# Patient Record
Sex: Female | Born: 1970 | Race: White | Hispanic: No | Marital: Married | Smoking: Never smoker
Health system: Southern US, Community
[De-identification: ages and names within clinical notes are randomized; demographics above are authoritative.]

## PROBLEM LIST (undated history)

## (undated) ENCOUNTER — Inpatient Hospital Stay (HOSPITAL_COMMUNITY): Payer: BC Managed Care – PPO

## (undated) DIAGNOSIS — O1490 Unspecified pre-eclampsia, unspecified trimester: Secondary | ICD-10-CM

## (undated) DIAGNOSIS — R609 Edema, unspecified: Secondary | ICD-10-CM

## (undated) DIAGNOSIS — Z98891 History of uterine scar from previous surgery: Secondary | ICD-10-CM

## (undated) DIAGNOSIS — K219 Gastro-esophageal reflux disease without esophagitis: Secondary | ICD-10-CM

## (undated) DIAGNOSIS — J302 Other seasonal allergic rhinitis: Secondary | ICD-10-CM

## (undated) DIAGNOSIS — R51 Headache: Secondary | ICD-10-CM

## (undated) HISTORY — PX: NO PAST SURGERIES: SHX2092

---

## 2000-04-12 ENCOUNTER — Other Ambulatory Visit: Admission: RE | Admit: 2000-04-12 | Discharge: 2000-04-12 | Payer: Self-pay | Admitting: Family Medicine

## 2001-05-15 ENCOUNTER — Emergency Department (HOSPITAL_COMMUNITY): Admission: EM | Admit: 2001-05-15 | Discharge: 2001-05-15 | Payer: Self-pay | Admitting: Emergency Medicine

## 2002-08-15 ENCOUNTER — Ambulatory Visit (HOSPITAL_COMMUNITY): Admission: RE | Admit: 2002-08-15 | Discharge: 2002-08-15 | Payer: Self-pay | Admitting: Family Medicine

## 2002-08-15 ENCOUNTER — Encounter: Payer: Self-pay | Admitting: Family Medicine

## 2003-02-24 ENCOUNTER — Ambulatory Visit (HOSPITAL_COMMUNITY): Admission: RE | Admit: 2003-02-24 | Discharge: 2003-02-24 | Payer: Self-pay | Admitting: Family Medicine

## 2003-02-24 ENCOUNTER — Encounter: Payer: Self-pay | Admitting: Family Medicine

## 2003-04-14 ENCOUNTER — Other Ambulatory Visit: Admission: RE | Admit: 2003-04-14 | Discharge: 2003-04-14 | Payer: Self-pay | Admitting: Obstetrics and Gynecology

## 2003-07-17 ENCOUNTER — Inpatient Hospital Stay (HOSPITAL_COMMUNITY): Admission: AD | Admit: 2003-07-17 | Discharge: 2003-07-17 | Payer: Self-pay | Admitting: Obstetrics and Gynecology

## 2003-07-18 ENCOUNTER — Inpatient Hospital Stay (HOSPITAL_COMMUNITY): Admission: AD | Admit: 2003-07-18 | Discharge: 2003-07-18 | Payer: Self-pay | Admitting: Obstetrics and Gynecology

## 2003-10-16 ENCOUNTER — Inpatient Hospital Stay (HOSPITAL_COMMUNITY): Admission: AD | Admit: 2003-10-16 | Discharge: 2003-10-20 | Payer: Self-pay | Admitting: Obstetrics and Gynecology

## 2003-10-17 ENCOUNTER — Encounter (INDEPENDENT_AMBULATORY_CARE_PROVIDER_SITE_OTHER): Payer: Self-pay | Admitting: Specialist

## 2004-09-02 ENCOUNTER — Other Ambulatory Visit: Admission: RE | Admit: 2004-09-02 | Discharge: 2004-09-02 | Payer: Self-pay | Admitting: Family Medicine

## 2006-03-09 ENCOUNTER — Other Ambulatory Visit: Admission: RE | Admit: 2006-03-09 | Discharge: 2006-03-09 | Payer: Self-pay | Admitting: Family Medicine

## 2007-03-11 ENCOUNTER — Emergency Department (HOSPITAL_COMMUNITY): Admission: EM | Admit: 2007-03-11 | Discharge: 2007-03-11 | Payer: Self-pay | Admitting: Emergency Medicine

## 2007-04-30 ENCOUNTER — Emergency Department (HOSPITAL_COMMUNITY): Admission: EM | Admit: 2007-04-30 | Discharge: 2007-05-01 | Payer: Self-pay | Admitting: Emergency Medicine

## 2007-08-16 ENCOUNTER — Other Ambulatory Visit: Admission: RE | Admit: 2007-08-16 | Discharge: 2007-08-16 | Payer: Self-pay | Admitting: Family Medicine

## 2008-08-18 ENCOUNTER — Other Ambulatory Visit: Admission: RE | Admit: 2008-08-18 | Discharge: 2008-08-18 | Payer: Self-pay | Admitting: Family Medicine

## 2008-09-18 ENCOUNTER — Encounter: Admission: RE | Admit: 2008-09-18 | Discharge: 2008-09-18 | Payer: Self-pay | Admitting: Family Medicine

## 2008-10-03 ENCOUNTER — Emergency Department (HOSPITAL_COMMUNITY): Admission: EM | Admit: 2008-10-03 | Discharge: 2008-10-04 | Payer: Self-pay | Admitting: Emergency Medicine

## 2008-10-04 ENCOUNTER — Emergency Department (HOSPITAL_BASED_OUTPATIENT_CLINIC_OR_DEPARTMENT_OTHER): Admission: EM | Admit: 2008-10-04 | Discharge: 2008-10-05 | Payer: Self-pay | Admitting: Emergency Medicine

## 2008-10-04 ENCOUNTER — Ambulatory Visit: Payer: Self-pay | Admitting: Diagnostic Radiology

## 2009-08-20 ENCOUNTER — Other Ambulatory Visit: Admission: RE | Admit: 2009-08-20 | Discharge: 2009-08-20 | Payer: Self-pay | Admitting: Family Medicine

## 2010-05-23 NOTE — L&D Delivery Note (Signed)
Brief Op Note PreOp Dx IUP @ 38+, SROM, undesired fertility, declines VBAC PostOp Dx same, delivered Procedure rLTCS, BTL by [facility] method Surgeon Bovard, Ashlynd Michna Anes Spinal Findings viable female infant at 22:47, apgars 9/9; wt  7#2oz,  Moderate meconium and nuchal cord noted.   nl uterus, tubes, ovaries Comp none Path placenta to L&D,B  tubal segments to path Dispo stable to PACU Dict # [id number]  Jeanella Cara, MD

## 2010-06-21 LAB — GC/CHLAMYDIA PROBE AMP, GENITAL
Chlamydia: NEGATIVE
Gonorrhea: NEGATIVE

## 2010-06-21 LAB — HEPATITIS B SURFACE ANTIGEN: Hepatitis B Surface Ag: NEGATIVE

## 2010-06-21 LAB — HIV ANTIBODY (ROUTINE TESTING W REFLEX): HIV: NONREACTIVE

## 2010-06-21 LAB — ABO/RH: "RH Type ": POSITIVE

## 2010-06-21 LAB — SYPHILIS: RPR W/REFLEX TO RPR TITER AND TREPONEMAL ANTIBODIES, TRADITIONAL SCREENING AND DIAGNOSIS ALGORITHM: RPR: NONREACTIVE

## 2010-06-21 LAB — RUBELLA ANTIBODY, IGM: Rubella: IMMUNE

## 2010-08-12 ENCOUNTER — Other Ambulatory Visit: Payer: Self-pay | Admitting: Obstetrics and Gynecology

## 2010-08-12 DIAGNOSIS — N644 Mastodynia: Secondary | ICD-10-CM

## 2010-08-13 ENCOUNTER — Other Ambulatory Visit: Payer: Self-pay

## 2010-08-31 LAB — COMPREHENSIVE METABOLIC PANEL
AST: 24 U/L (ref 0–37)
Albumin: 3.9 g/dL (ref 3.5–5.2)
BUN: 11 mg/dL (ref 6–23)
CO2: 24 mEq/L (ref 19–32)
Chloride: 105 mEq/L (ref 96–112)
Chloride: 106 mEq/L (ref 96–112)
Creatinine, Ser: 0.8 mg/dL (ref 0.4–1.2)
GFR calc Af Amer: >60 mL/min (ref >60)
GFR calc non Af Amer: >60 mL/min (ref >60)
GFR calc non Af Amer: >60 mL/min (ref >60)
Glucose, Bld: 87 mg/dL (ref 70–99)
Potassium: 3.6 mEq/L (ref 3.5–5.1)
Sodium: 136 mEq/L (ref 135–145)
Total Bilirubin: 0.6 mg/dL (ref 0.3–1.2)
Total Protein: 6.9 g/dL (ref 6.0–8.3)

## 2010-08-31 LAB — WET PREP, GENITAL
Clue Cells Wet Prep HPF POC: NONE SEEN
Trich, Wet Prep: NONE SEEN

## 2010-08-31 LAB — DIFFERENTIAL
Basophils Absolute: 0.1 10*3/uL (ref 0.0–0.1)
Basophils Relative: 1 % (ref 0–1)
Eosinophils Relative: 3 % (ref 0–5)
Lymphocytes Relative: 28 % (ref 12–46)
Monocytes Absolute: 0.7 10*3/uL (ref 0.1–1.0)
Neutrophils Relative %: 59 % (ref 43–77)

## 2010-08-31 LAB — PREGNANCY, URINE: Preg Test, Ur: NEGATIVE

## 2010-08-31 LAB — CBC
HCT: 38.9 % (ref 36.0–46.0)
Hemoglobin: 12.9 g/dL (ref 12.0–15.0)
MCV: 89.8 fL (ref 78.0–100.0)
Platelets: 200 10*3/uL (ref 150–400)

## 2010-08-31 LAB — POCT PREGNANCY, URINE: Preg Test, Ur: NEGATIVE

## 2010-08-31 LAB — URINALYSIS, ROUTINE W REFLEX MICROSCOPIC
Bilirubin Urine: NEGATIVE
Glucose, UA: NEGATIVE mg/dL
Glucose, UA: NEGATIVE mg/dL
Hgb urine dipstick: NEGATIVE
Nitrite: NEGATIVE
Specific Gravity, Urine: 1.016 (ref 1.005–1.030)
Specific Gravity, Urine: 1.026 (ref 1.005–1.030)

## 2010-08-31 LAB — GC/CHLAMYDIA PROBE AMP, GENITAL
Chlamydia, DNA Probe: NEGATIVE
GC Probe Amp, Genital: NEGATIVE

## 2010-08-31 LAB — LIPASE, BLOOD: Lipase: 20 U/L (ref 11–59)

## 2010-08-31 LAB — URINE CULTURE: Colony Count: NO GROWTH

## 2010-10-08 NOTE — Discharge Summary (Signed)
NAME:  Dawn Dalton, Dawn Dalton                          ACCOUNT NO.:  [id number]   MEDICAL RECORD NO.:  [id number]                   PATIENT TYPE:  INP   LOCATION:  9112                                 FACILITY:  [facility]   PHYSICIAN:  Malachi Pro. Ambrose Mantle, M.D.              DATE OF BIRTH:  02/05/1971   DATE OF ADMISSION:  10/16/2003  DATE OF DISCHARGE:  10/20/2003                                 DISCHARGE SUMMARY   PATIENT IDENTIFICATION:  This is a 40 year old white married female; para 0,  gravida 1, with an EDC of November 01, 2003 by ultrasound.  She was admitted in  early labor with an elevated temperature.   PRENATAL LAB DATA:  Blood group and type 0 positive, with a negative  antibody, nonreactive serology, Rubella immune, hepatitis B surface antigen  negative.  HIV declined.  GC and chlamydia negative.  Group B strep  positive.  One-hour Glucola 111.   HOSPITAL COURSE:  The patient's prenatal course and progress in labor has  been described in the history and physical.  In summary, she was admitted,  observed in labor with an elevated fever -- the origin of which was  uncertain.  She was treated with Unasyn.  She reached full dilatation,  pushed for three hours, was unable to bring the vertex below a 0 station.  She was taken to the operating room and underwent a low transverse cervical  cesarean section by Dr. Ambrose Mantle, under spinal anesthesia and with delivery of  a living female infant -- 6 pounds 10 ounces. Apgars were 2 at one minute  and 9 at five minutes.   Dr. Chales Abrahams T. Dimaguila thought the low Apgar at one minute was due to  secretions in the baby's upper airway.  The cord pH was 7.27.   At the time of the cesarean section the patient was noted to have a knot in  the cord and also multiple fibroids.  Postpartum she became afebrile, had a  good postpartum course. She was ambulating well, voiding well, passing  flatus and had a bowel movement.  On the third postoperative day was  ready  for discharge.   Staples were removed and strips were applied.   LABORATORY DATA:  Urine culture was negative.  Initial hemoglobin 13.6,  hematocrit 40.7, white count 14,400, platelet count 255,000.  Follow-up  hemoglobin 12.5, white count 21,400.  Comprehensive metabolic profile was  normal.  RPR was nonreactive.   FINAL DIAGNOSES:  1. Intrauterine pregnancy at 37+ weeks.  Delivered OP by cesarean section.  2. Maternal fever of unknown origin.  3. Cephalopelvic disproportion.  4. Multiple fibroids.   OPERATION:  Low transverse cervical cesarean section.   FINAL CONDITION:  Improved.   DISCHARGE INSTRUCTIONS:  Our regular discharge instruction booklet.  Percocet 5/325 mg 20 tablets one q.4-6h. p.r.n. pain.  The patient is  advised to return in 10-14 days for follow-up  examination.                                               Malachi Pro. Ambrose Mantle, M.D.    TFH/MEDQ  D:  10/20/2003  T:  10/20/2003  Job:  [id number]

## 2010-10-08 NOTE — Op Note (Signed)
NAME:  Dawn Dalton, Dawn Dalton                          ACCOUNT NO.:  [id number]   MEDICAL RECORD NO.:  [id number]                   PATIENT TYPE:  INP   LOCATION:  9112                                 FACILITY:  [facility]   PHYSICIAN:  Malachi Pro. Ambrose Mantle, M.D.              DATE OF BIRTH:  [birth year]   DATE OF PROCEDURE:  10/17/2003  DATE OF DISCHARGE:                                 OPERATIVE REPORT   PREOPERATIVE DIAGNOSES:  1. Intrauterine pregnancy at 37+ weeks.  2. Cephalopelvic disproportion.  3. Maternal fever, unknown origin.  4. Multiple fibroids.   POSTOPERATIVE DIAGNOSIS:  1. Intrauterine pregnancy at 37+ weeks.  2. Cephalopelvic disproportion.  3. Maternal fever, unknown origin.  4. Multiple fibroids.  5. Vertex occiput posterior.  6. Knot in the cord.   OPERATION:  Low transverse cervical cesarean section.   OPERATOR:  Malachi Pro. Ambrose Mantle, M.D.   ANESTHESIA:  Spinal.   The patient was brought to the operating room and the epidural anesthetic  was apparently ineffective so Dr. Arby Barrette did a spinal.  This took close to  15 minutes because either the spinal fluid pressure was low or he could not  aspirate the fluid even though he was in the spinal canal.  Finally he did  give the anesthetic, she became completely numb at the incision site.  The  abdomen was prepped with Betadine solution after fetal heart tones were  confirmed to be normal.  The area was draped as a sterile field and the  transverse incision was made and carried in layers through the skin,  subcutaneous tissue, and fascia.  The fascia was separated from the rectus  muscles superiorly and inferiorly.  The rectus muscle was split in the  midline . The peritoneum was opened vertically.  The lower uterine segment  peritoneum was incised and pushed inferiorly.  The bladder was retracted  away and incision was then made through the superficial layer of the  myometrium and then with my finger I entered the amniotic  cavity; clear  fluid was obtained.  The shoulder was right underneath the incision. I  enlarged the incision by pulling inferiorly and superiorly on the incision.  The infant's head was OP directly and was sort of wedged into the pelvis.  I  was able to get below it and also with a little bit of the nurse's  assistance pushing the head up, rotated the head around to an anterior  position and then delivered the vertex without difficulty.  The nose and  pharynx were suctioned with the bulb.  The remainder of the baby was  delivered, the cord was clamped, and the infant was given to Dr. Francine Graven  who was in attendance.  The infant was a 6-pound 10-ounce female with Apgars  of 2 - 1 for heart rate and 1 for respirations at one minute, and 9 at five  minutes.  Dr. Francine Graven  felt like the low Apgar came from secretions in the  upper airway.  A segment of cord was preserved in case a pH was necessary.  Dr. Francine Graven advised getting the pH.  The pH was 7.27.  A knot was found in  the cord.  Routine cord blood studies were obtained.  The placenta was  removed.  The inside of the uterus was inspected and found to be free of  debris.  There was what felt like a submucous fibroid, maybe intramural and  submucous, on the left anterior mid uterus.  There were multiple fibroids  present subserosally.  After the edges of the myometrium were grasped with  ring forceps the uterine incision was closed with two running sutures of 0  Vicryl, locking the first layer and nonlocking on the second.  One extra  suture was required for complete hemostasis.  Liberal irrigation confirmed  hemostasis.  Multiple fibroids were again noted.  Both tubes and ovaries  appeared normal.  Both gutters were blotted free of blood.  Hemostasis was  adequate.  The abdominal wall was closed by closing the rectus muscle with  interrupted 0 Vicryl, two running sutures of 0 Vicryl were used to close the  fascia, a running 3-0 Vicryl in  the subcutaneous tissue, and staples on the  skin.  Neither the visceral nor the parietal peritoneum were reapproximated.  Blood loss was thought to be about 1000 mL even though this was not  accounted in the suction.  The urine output prior to the C-section was low.  There was only about 100 mL during the procedure so we are going to change  the catheter.  The patient seemed tolerate the procedure well and was  returned to recovery in satisfactory condition.                                               Malachi Pro. Ambrose Mantle, M.D.    TFH/MEDQ  D:  10/17/2003  T:  10/17/2003  Job:  [id number]

## 2010-10-08 NOTE — H&P (Signed)
NAME:  Dawn Dalton, Dawn Dalton                          ACCOUNT NO.:  [id number]   MEDICAL RECORD NO.:  [id number]                   PATIENT TYPE:  INP   LOCATION:  9112                                 [facility]   PHYSICIAN:  Malachi Pro. Ambrose Mantle, M.D.              DATE OF BIRTH:  17-Nov-1970   DATE OF ADMISSION:  10/16/2003  DATE OF DISCHARGE:                                HISTORY & PHYSICAL   HISTORY OF PRESENT ILLNESS:  A 39 year old white married female para 0  gravida 1 with [facility] November 01, 2003 by ultrasound admitted in early labor with  elevated temperature.  Blood group and type O positive, negative antibody,  nonreactive serology, rubella immune, hepatitis B surface antigen negative,  HIV declined, GC and chlamydia negative, group B strep positive, one-hour  Glucola 111.  Vaginal ultrasound on March 13, 2003;  Crown-rump length  0.81 cm, 6 weeks 5 days; [facility] November 01, 2003.  Positive urine group B strep  was treated with Penicillin VK.  The patient had bleeding at 12+ weeks.  Ultrasound at 18 weeks 3 days showed an [facility] of November 02, 2003 with multiple  fibroids.  The patient had contractions at 26 weeks.  Fetal fibronectin was  negative.  She took terbutaline on an as-needed basis.  On Oct 15, 2003 the  patient had an exam in our office and the cervix was 1 cm, 80%.  She  returned to our office on Oct 16, 2003.  She was sent to maternity admission  and labor ensued.  She had no history of fever.  She had no GI or urinary  symptoms except frequency.   PAST MEDICAL HISTORY:  1. Allergies to EES caused stomach pain, VIBRAMYCIN causes hives.  2. Illnesses:  Abnormal Pap, depression.  3. Operations:  Dental surgery, LEEP surgery done in 2002.   FAMILY HISTORY:  Maternal grandmother with an MI and mini stroke.  Father  with renal disease, kidney cancer, and depression.  Brother with  schizophrenia.   ALCOHOL, TOBACCO, AND DRUGS:  None.   PHYSICAL EXAMINATION:  VITAL SIGNS:  On admission  the patient's temperature  was 100.6; it actually went as high as 101+.  Blood pressure 130/73, pulse  83, respirations 20.  HEART AND LUNGS:  Normal.  ABDOMEN:  Soft, fundal height 38 cm, fetal heart tones normal.  The baseline  fetal heart was 160 thought to be secondary to maternal temperature  elevation.  There was good reactivity, no decelerations.  PELVIC:  The cervix was 3 cm, 90%, vertex at a -2.   The patient was initially given penicillin for the group B strep but after  it was discovered that she had an elevated temperature she was placed on  Unasyn.  She received an epidural at approximately 7:20 p.m.  At one point  there was a fetal heart rate deceleration and she received ephedrine.  Fetal  heart  rate tachycardia persisted, probably secondary to elevated maternal  temperature.  Variability was intermittent - at times the variability was  excellent, at other times the baby seemed somewhat flat.  There were no  significant decelerations.  By 11:10 p.m. the cervix was 8 cm.  She reached  full dilatation at 12:25 a.m. and by 2:25 a.m. the vertex was at a 0  station.  I could not be sure if it was OP  or OA.  The patient did not want an instrumental delivery.  She was given  another 40 minutes or so and at 3 a.m. the cervix was unchanged.  It was  still 10 cm, vertex was still at a 0 station, and vertex was probably OP.  We decided to proceed with C-section.  The patient was informed and she and  her husband agreed.                                               Malachi Pro. Ambrose Mantle, M.D.    TFH/MEDQ  D:  10/17/2003  T:  10/17/2003  Job:  [id number]

## 2010-12-24 ENCOUNTER — Inpatient Hospital Stay (HOSPITAL_COMMUNITY)
Admission: AD | Admit: 2010-12-24 | Discharge: 2010-12-24 | Disposition: A | Discharge status: Home / Self Care | Payer: BC Managed Care – PPO | Source: Ambulatory Visit | Attending: Obstetrics and Gynecology | Admitting: Obstetrics and Gynecology

## 2010-12-24 ENCOUNTER — Encounter (HOSPITAL_COMMUNITY): Payer: Self-pay

## 2010-12-24 DIAGNOSIS — O133 Gestational [pregnancy-induced] hypertension without significant proteinuria, third trimester: Secondary | ICD-10-CM

## 2010-12-24 DIAGNOSIS — O139 Gestational [pregnancy-induced] hypertension without significant proteinuria, unspecified trimester: Secondary | ICD-10-CM | POA: Insufficient documentation

## 2010-12-24 LAB — URINALYSIS, ROUTINE W REFLEX MICROSCOPIC
Bilirubin Urine: NEGATIVE
Ketones, ur: 15 mg/dL — AB
Nitrite: NEGATIVE
Protein, ur: NEGATIVE mg/dL
Specific Gravity, Urine: >1.03 — ABNORMAL HIGH (ref 1.005–1.030)
Urobilinogen, UA: 0.2 mg/dL (ref 0.0–1.0)

## 2010-12-24 LAB — COMPREHENSIVE METABOLIC PANEL
Albumin: 2.8 g/dL — ABNORMAL LOW (ref 3.5–5.2)
Alkaline Phosphatase: 110 U/L (ref 39–117)
BUN: 13 mg/dL (ref 6–23)
Chloride: 104 mEq/L (ref 96–112)
Creatinine, Ser: 0.7 mg/dL (ref 0.50–1.10)
GFR calc Af Amer: >60 mL/min (ref >60)
GFR calc non Af Amer: >60 mL/min (ref >60)
Glucose, Bld: 97 mg/dL (ref 70–99)
Potassium: 4.3 mEq/L (ref 3.5–5.1)
Total Bilirubin: 0.2 mg/dL — ABNORMAL LOW (ref 0.3–1.2)

## 2010-12-24 LAB — CBC
HCT: 33.5 % — ABNORMAL LOW (ref 36.0–46.0)
Hemoglobin: 11.4 g/dL — ABNORMAL LOW (ref 12.0–15.0)
MCV: 88.2 fL (ref 78.0–100.0)
RDW: 12.7 % (ref 11.5–15.5)
WBC: 9.1 10*3/uL (ref 4.0–10.5)

## 2010-12-24 LAB — URINE MICROSCOPIC-ADD ON

## 2010-12-24 LAB — LACTATE DEHYDROGENASE: LDH: 172 U/L (ref 94–250)

## 2010-12-24 LAB — URIC ACID: Uric Acid, Serum: 6.1 mg/dL (ref 2.4–7.0)

## 2010-12-24 NOTE — ED Provider Notes (Signed)
S:  Dawn Dalton is a 40 y.o. G2P1001 at [redacted]w[redacted]d sent for labs and further evaluation following her routine office visit due to BP elevation. She reports this was the first episode of BP elevation. No H/A, visual disturbance, epigastric pain. No LOF or VB. Good FM. Reports nl 3hr OGTT. O: Filed Vitals:   12/24/10 1552  BP: 134/86  Pulse: 92  Temp:   Resp:    BP range 132-140/88-98 FHR 130 baseline, reactive, mod variability, Cat I  Results for orders placed during the hospital encounter of 12/24/10 (from the past 24 hour(s))  URINALYSIS, ROUTINE W REFLEX MICROSCOPIC     Status: Abnormal   Collection Time   12/24/10  3:10 PM      Component Value Range   Color, Urine YELLOW  YELLOW    Appearance HAZY (*) CLEAR    Specific Gravity, Urine >1.030 (*) 1.005 - 1.030    pH 5.5  5.0 - 8.0    Glucose, UA NEGATIVE  NEGATIVE (mg/dL)   Hgb urine dipstick TRACE (*) NEGATIVE    Bilirubin Urine NEGATIVE  NEGATIVE    Ketones, ur 15 (*) NEGATIVE (mg/dL)   Protein, ur NEGATIVE  NEGATIVE (mg/dL)   Urobilinogen, UA 0.2  0.0 - 1.0 (mg/dL)   Nitrite NEGATIVE  NEGATIVE    Leukocytes, UA NEGATIVE  NEGATIVE   URINE MICROSCOPIC-ADD ON     Status: Abnormal   Collection Time   12/24/10  3:10 PM      Component Value Range   Squamous Epithelial / LPF FEW (*) RARE    WBC, UA 0-2  <3 (WBC/hpf)   Crystals CA OXALATE CRYSTALS (*) NEGATIVE   CBC     Status: Abnormal   Collection Time   12/24/10  3:30 PM      Component Value Range   WBC 9.1  4.0 - 10.5 (K/uL)   RBC 3.80 (*) 3.87 - 5.11 (MIL/uL)   Hemoglobin 11.4 (*) 12.0 - 15.0 (g/dL)   HCT 40.9 (*) 81.1 - 46.0 (%)   MCV 88.2  78.0 - 100.0 (fL)   MCH 30.0  26.0 - 34.0 (pg)   MCHC 34.0  30.0 - 36.0 (g/dL)   RDW 91.4  78.2 - 95.6 (%)   Platelets 205  150 - 400 (K/uL)   Results for orders placed during the hospital encounter of 12/24/10 (from the past 24 hour(s))  URINALYSIS, ROUTINE W REFLEX MICROSCOPIC     Status: Abnormal   Collection Time   12/24/10  3:10  PM      Component Value Range   Color, Urine YELLOW  YELLOW    Appearance HAZY (*) CLEAR    Specific Gravity, Urine >1.030 (*) 1.005 - 1.030    pH 5.5  5.0 - 8.0    Glucose, UA NEGATIVE  NEGATIVE (mg/dL)   Hgb urine dipstick TRACE (*) NEGATIVE    Bilirubin Urine NEGATIVE  NEGATIVE    Ketones, ur 15 (*) NEGATIVE (mg/dL)   Protein, ur NEGATIVE  NEGATIVE (mg/dL)   Urobilinogen, UA 0.2  0.0 - 1.0 (mg/dL)   Nitrite NEGATIVE  NEGATIVE    Leukocytes, UA NEGATIVE  NEGATIVE   URINE MICROSCOPIC-ADD ON     Status: Abnormal   Collection Time   12/24/10  3:10 PM      Component Value Range   Squamous Epithelial / LPF FEW (*) RARE    WBC, UA 0-2  <3 (WBC/hpf)   Crystals CA OXALATE CRYSTALS (*) NEGATIVE   CBC  Status: Abnormal   Collection Time   12/24/10  3:30 PM      Component Value Range   WBC 9.1  4.0 - 10.5 (K/uL)   RBC 3.80 (*) 3.87 - 5.11 (MIL/uL)   Hemoglobin 11.4 (*) 12.0 - 15.0 (g/dL)   HCT 40.9 (*) 81.1 - 46.0 (%)   MCV 88.2  78.0 - 100.0 (fL)   MCH 30.0  26.0 - 34.0 (pg)   MCHC 34.0  30.0 - 36.0 (g/dL)   RDW 91.4  78.2 - 95.6 (%)   Platelets 205  150 - 400 (K/uL)  COMPREHENSIVE METABOLIC PANEL     Status: Abnormal   Collection Time   12/24/10  3:30 PM      Component Value Range   Sodium 136  135 - 145 (mEq/L)   Potassium 4.3  3.5 - 5.1 (mEq/L)   Chloride 104  96 - 112 (mEq/L)   CO2 24  19 - 32 (mEq/L)   Glucose, Bld 97  70 - 99 (mg/dL)   BUN 13  6 - 23 (mg/dL)   Creatinine, Ser 2.13  0.50 - 1.10 (mg/dL)   Calcium 9.1  8.4 - 08.6 (mg/dL)   Total Protein 6.7  6.0 - 8.3 (g/dL)   Albumin 2.8 (*) 3.5 - 5.2 (g/dL)   AST 16  0 - 37 (U/L)   ALT 12  0 - 35 (U/L)   Alkaline Phosphatase 110  39 - 117 (U/L)   Total Bilirubin 0.2 (*) 0.3 - 1.2 (mg/dL)   GFR calc non Af Amer >60  >60 (mL/min)   GFR calc Af Amer >60  >60 (mL/min)  URIC ACID     Status: Normal   Collection Time   12/24/10  3:30 PM      Component Value Range   Uric Acid, Serum 6.1  2.4 - 7.0 (mg/dL)  LACTATE  DEHYDROGENASE     Status: Normal   Collection Time   12/24/10  3:30 PM      Component Value Range   LD 172  94 - 250 (U/L)   A/P: GHTN without evidence preE. Discussed w/ Dr. Senaida Ores. F/U in office 12/28/10. Home with preE precautions, kick counts.

## 2010-12-24 NOTE — Progress Notes (Signed)
Pt states she has had more swelling in her feet, ankles and hands. Has had nausea and some periods of dizziness. No headache or blurred vision. Pt reports good fetal movement. Was sent from the office for evaluation of elevated BP/

## 2011-01-07 ENCOUNTER — Encounter (HOSPITAL_COMMUNITY): Payer: Self-pay

## 2011-01-07 ENCOUNTER — Encounter (HOSPITAL_COMMUNITY)
Admission: RE | Admit: 2011-01-07 | Discharge: 2011-01-07 | Disposition: A | Discharge status: Home / Self Care | Payer: BC Managed Care – PPO | Source: Ambulatory Visit | Attending: Obstetrics and Gynecology | Admitting: Obstetrics and Gynecology

## 2011-01-07 ENCOUNTER — Encounter (HOSPITAL_COMMUNITY): Payer: Self-pay | Admitting: Anesthesiology

## 2011-01-07 ENCOUNTER — Encounter (HOSPITAL_COMMUNITY): Payer: Self-pay | Admitting: *Deleted

## 2011-01-07 HISTORY — DX: Edema, unspecified: R60.9

## 2011-01-07 HISTORY — DX: Gastro-esophageal reflux disease without esophagitis: K21.9

## 2011-01-07 HISTORY — DX: Other seasonal allergic rhinitis: J30.2

## 2011-01-07 HISTORY — DX: Headache: R51

## 2011-01-07 LAB — BASIC METABOLIC PANEL
BUN: 13 mg/dL (ref 6–23)
CO2: 24 mEq/L (ref 19–32)
Calcium: 8.7 mg/dL (ref 8.4–10.5)
GFR calc non Af Amer: >60 mL/min (ref >60)
Glucose, Bld: 92 mg/dL (ref 70–99)
Sodium: 135 mEq/L (ref 135–145)

## 2011-01-07 LAB — CBC
HCT: 34.4 % — ABNORMAL LOW (ref 36.0–46.0)
MCH: 30.1 pg (ref 26.0–34.0)
MCV: 87.8 fL (ref 78.0–100.0)
RBC: 3.92 MIL/uL (ref 3.87–5.11)
RDW: 13.1 % (ref 11.5–15.5)
WBC: 10.2 10*3/uL (ref 4.0–10.5)

## 2011-01-07 NOTE — Patient Instructions (Addendum)
Dawn Dalton  Your procedure is scheduled [id number] 01/18/11 Enter through the Main Entrance of [facility] at:6 AM Pick up the phone a the desk and dial [phone] Please call this number if you have any problems the morning of surgery: [phone]  Remember: Do not eat food after midnight  Do not drink clear liquids after:night Take these medicines the morning of surgery with a SIP OF WATER:zantac  Do not wear jewelry, make-up, or nail polish Do not wear lotions, powders, or perfumes.no deodorants Do not shave 48 hours prior to surgery. Do not bring valuables to the hospital. Leave suitcase in the car. After Surgery it may be brought to your room. For patients being admitted to the hospital, checkout time is 11:00am the day of discharge.  Please read over the Hibiclens Guide to General Skin Cleansing at Home. Remember that hibiclens is not to be used on the head, face, or vaginal area. Please shower with 1/2 bottle the evening before surgery and other 1/2 bottle morning of surgery prior to arrival at hospital.  Do not use any deodorants, lotions, or powders after hibiclens shower.

## 2011-01-07 NOTE — Pre-Procedure Instructions (Signed)
Dr Senaida Ores office nurse notified of pt allergy to Cefdinir (cephalosporin) and past reaction to PCN and all derivatives..the patient does not want to receive ancef that is ordered.office notified. Awaiting further orders for antibiotic [facility]

## 2011-01-07 NOTE — Anesthesia Preprocedure Evaluation (Signed)
Anesthesia Evaluation  Name, MR# and DOB Patient awake  General Assessment Comment  Reviewed: Allergy & Precautions, H&P , NPO status , Patient's Chart, lab work & pertinent test results, reviewed documented beta blocker date and time   History of Anesthesia Complications Negative for: history of anesthetic complications  Airway Mallampati: I TM Distance: >3 FB Neck ROM: full    Dental  (+) Teeth Intact   Pulmonary  clear to auscultation  breath sounds clear to auscultation none    Cardiovascular hypertension (elev BP x2 weeks, had pre-e w/u which was neg), regular Normal    Neuro/Psych   Headaches (recent migraine, has had HAs x3 days)   Negative Psych ROS  GI/Hepatic/Renal negative Liver ROS, and negative Renal ROS (+)  GERD Medicated and Poorly Controlled     Endo/Other  Negative Endocrine ROS (+)      Abdominal   Musculoskeletal negative musculoskeletal ROS (+)   Hematology negative hematology ROS (+)   Peds  Reproductive/Obstetrics (+) Pregnancy    Anesthesia Other Findings             Anesthesia Physical Anesthesia Plan  ASA: II  Anesthesia Plan: Spinal   Post-op Pain Management:    Induction:   Airway Management Planned:   Additional Equipment:   Intra-op Plan:   Post-operative Plan:   Informed Consent: I have reviewed the patients History and Physical, chart, labs and discussed the procedure including the risks, benefits and alternatives for the proposed anesthesia with the patient or authorized representative who has indicated his/her understanding and acceptance.   Dental Advisory Given  Plan Discussed with:   Anesthesia Plan Comments:         Anesthesia Quick Evaluation

## 2011-01-15 ENCOUNTER — Inpatient Hospital Stay: Admit: 2011-01-15 | Payer: Self-pay | Admitting: Obstetrics and Gynecology

## 2011-01-15 ENCOUNTER — Encounter (HOSPITAL_COMMUNITY)
Admission: AD | Disposition: A | Discharge status: Home / Self Care | Payer: Self-pay | Source: Ambulatory Visit | Attending: Obstetrics and Gynecology

## 2011-01-15 ENCOUNTER — Encounter (HOSPITAL_COMMUNITY): Payer: Self-pay | Admitting: Obstetrics and Gynecology

## 2011-01-15 ENCOUNTER — Inpatient Hospital Stay (HOSPITAL_COMMUNITY)
Admission: AD | Admit: 2011-01-15 | Discharge: 2011-01-18 | DRG: 371 | Disposition: A | Discharge status: Home / Self Care | Payer: BC Managed Care – PPO | Source: Ambulatory Visit | Attending: Obstetrics and Gynecology | Admitting: Obstetrics and Gynecology

## 2011-01-15 ENCOUNTER — Encounter (HOSPITAL_COMMUNITY): Payer: Self-pay | Admitting: Anesthesiology

## 2011-01-15 ENCOUNTER — Inpatient Hospital Stay (HOSPITAL_COMMUNITY): Payer: BC Managed Care – PPO | Admitting: Anesthesiology

## 2011-01-15 ENCOUNTER — Other Ambulatory Visit: Payer: Self-pay | Admitting: Obstetrics and Gynecology

## 2011-01-15 DIAGNOSIS — Z3490 Encounter for supervision of normal pregnancy, unspecified, unspecified trimester: Secondary | ICD-10-CM

## 2011-01-15 DIAGNOSIS — Z98891 History of uterine scar from previous surgery: Secondary | ICD-10-CM

## 2011-01-15 DIAGNOSIS — O09529 Supervision of elderly multigravida, unspecified trimester: Secondary | ICD-10-CM | POA: Diagnosis present

## 2011-01-15 DIAGNOSIS — O1490 Unspecified pre-eclampsia, unspecified trimester: Secondary | ICD-10-CM

## 2011-01-15 DIAGNOSIS — Z302 Encounter for sterilization: Secondary | ICD-10-CM

## 2011-01-15 DIAGNOSIS — O34219 Maternal care for unspecified type scar from previous cesarean delivery: Principal | ICD-10-CM | POA: Diagnosis present

## 2011-01-15 HISTORY — DX: History of uterine scar from previous surgery: Z98.891

## 2011-01-15 HISTORY — DX: Unspecified pre-eclampsia, unspecified trimester: O14.90

## 2011-01-15 LAB — COMPREHENSIVE METABOLIC PANEL
ALT: 27 U/L (ref 0–35)
BUN: 12 mg/dL (ref 6–23)
CO2: 20 mEq/L (ref 19–32)
Calcium: 9.3 mg/dL (ref 8.4–10.5)
Creatinine, Ser: 0.73 mg/dL (ref 0.50–1.10)
GFR calc Af Amer: >60 mL/min (ref >60)
GFR calc non Af Amer: >60 mL/min (ref >60)
Glucose, Bld: 115 mg/dL — ABNORMAL HIGH (ref 70–99)
Sodium: 134 mEq/L — ABNORMAL LOW (ref 135–145)
Total Protein: 6.1 g/dL (ref 6.0–8.3)

## 2011-01-15 LAB — CBC
Hemoglobin: 11.4 g/dL — ABNORMAL LOW (ref 12.0–15.0)
MCH: 29.8 pg (ref 26.0–34.0)
MCHC: 34 g/dL (ref 30.0–36.0)
MCV: 87.7 fL (ref 78.0–100.0)
RBC: 3.82 MIL/uL — ABNORMAL LOW (ref 3.87–5.11)

## 2011-01-15 LAB — URIC ACID: Uric Acid, Serum: 5.9 mg/dL (ref 2.4–7.0)

## 2011-01-15 SURGERY — Surgical Case
Anesthesia: Spinal | Site: Abdomen | Laterality: Bilateral | Wound class: Clean Contaminated

## 2011-01-15 MED ORDER — SCOPOLAMINE 1 MG/3DAYS TD PT72
1.0000 | MEDICATED_PATCH | Freq: Once | TRANSDERMAL | Status: DC
  Administered 2011-01-16: 1.5 mg via TRANSDERMAL

## 2011-01-15 MED ORDER — CITRIC ACID-SODIUM CITRATE 334-500 MG/5ML PO SOLN
ORAL | Status: AC
  Filled 2011-01-15: qty 15

## 2011-01-15 MED ORDER — SODIUM CHLORIDE 0.9 % IR SOLN
Status: DC | PRN
  Administered 2011-01-15: 1000 mL

## 2011-01-15 MED ORDER — ONDANSETRON HCL 4 MG/2ML IJ SOLN
INTRAMUSCULAR | Status: DC | PRN
  Administered 2011-01-15: 4 mg via INTRAVENOUS

## 2011-01-15 MED ORDER — LACTATED RINGERS IV BOLUS (SEPSIS)
1000.0000 mL | Freq: Once | INTRAVENOUS | Status: AC
  Administered 2011-01-15: 1000 mL via INTRAVENOUS

## 2011-01-15 MED ORDER — KETOROLAC TROMETHAMINE 60 MG/2ML IM SOLN
60.0000 mg | Freq: Once | INTRAMUSCULAR | Status: AC | PRN
  Administered 2011-01-16: 60 mg via INTRAMUSCULAR

## 2011-01-15 MED ORDER — FENTANYL CITRATE 0.05 MG/ML IJ SOLN
INTRAMUSCULAR | Status: AC
  Filled 2011-01-15: qty 2

## 2011-01-15 MED ORDER — OXYTOCIN 10 UNIT/ML IJ SOLN
INTRAMUSCULAR | Status: AC
  Filled 2011-01-15: qty 4

## 2011-01-15 MED ORDER — FENTANYL CITRATE 0.05 MG/ML IJ SOLN
INTRAMUSCULAR | Status: DC | PRN
  Administered 2011-01-15: 85 ug via INTRAVENOUS

## 2011-01-15 MED ORDER — MORPHINE SULFATE (PF) 0.5 MG/ML IJ SOLN
INTRAMUSCULAR | Status: DC | PRN
  Administered 2011-01-15: 1 mg via EPIDURAL
  Administered 2011-01-15: 1 mg via INTRAVENOUS

## 2011-01-15 MED ORDER — FENTANYL CITRATE 0.05 MG/ML IJ SOLN
INTRAMUSCULAR | Status: DC | PRN
  Administered 2011-01-15: 15 ug via INTRATHECAL

## 2011-01-15 MED ORDER — LACTATED RINGERS IV SOLN
INTRAVENOUS | Status: DC | PRN
  Administered 2011-01-15: 22:00:00 via INTRAVENOUS

## 2011-01-15 MED ORDER — BUPIVACAINE IN DEXTROSE 0.75-8.25 % IT SOLN
INTRATHECAL | Status: DC | PRN
  Administered 2011-01-15: 1.5 mL via INTRATHECAL

## 2011-01-15 MED ORDER — CLINDAMYCIN PHOSPHATE 600 MG/50ML IV SOLN
INTRAVENOUS | Status: DC | PRN
  Administered 2011-01-15: 900 mg via INTRAVENOUS

## 2011-01-15 MED ORDER — OXYTOCIN 10 UNIT/ML IJ SOLN
40.0000 [IU] | INTRAVENOUS | Status: DC | PRN
  Administered 2011-01-15: 40 [IU] via INTRAVENOUS

## 2011-01-15 MED ORDER — MEPERIDINE HCL 25 MG/ML IJ SOLN
6.2500 mg | INTRAMUSCULAR | Status: DC | PRN
  Administered 2011-01-16 (×2): 6.25 mg via INTRAVENOUS

## 2011-01-15 MED ORDER — MORPHINE SULFATE (PF) 0.5 MG/ML IJ SOLN
INTRAMUSCULAR | Status: DC | PRN
  Administered 2011-01-15: .1 mg via INTRATHECAL

## 2011-01-15 MED ORDER — GENTAMICIN IN SALINE 1.6-0.9 MG/ML-% IV SOLN
INTRAVENOUS | Status: DC | PRN
  Administered 2011-01-15: 100 mg via INTRAVENOUS

## 2011-01-15 MED ORDER — OXYTOCIN 20 UNITS IN LACTATED RINGERS INFUSION - SIMPLE
125.0000 mL/h | INTRAVENOUS | Status: AC
  Administered 2011-01-16 (×2): 125 mL/h via INTRAVENOUS
  Filled 2011-01-15: qty 1000

## 2011-01-15 MED ORDER — ONDANSETRON HCL 4 MG/2ML IJ SOLN
INTRAMUSCULAR | Status: AC
  Filled 2011-01-15: qty 2

## 2011-01-15 MED ORDER — GENTAMICIN SULFATE 40 MG/ML IJ SOLN
INTRAVENOUS | Status: DC
  Filled 2011-01-15: qty 2.5

## 2011-01-15 MED ORDER — CITRIC ACID-SODIUM CITRATE 334-500 MG/5ML PO SOLN
30.0000 mL | Freq: Once | ORAL | Status: AC
  Administered 2011-01-15: 30 mL via ORAL

## 2011-01-15 MED ORDER — MORPHINE SULFATE 0.5 MG/ML IJ SOLN
INTRAMUSCULAR | Status: AC
  Filled 2011-01-15: qty 10

## 2011-01-15 SURGICAL SUPPLY — 31 items
CHLORAPREP W/TINT 26ML (MISCELLANEOUS) ×2 IMPLANT
CLOTH BEACON ORANGE TIMEOUT ST (SAFETY) ×2 IMPLANT
CONTAINER PREFILL 10% NBF 15ML (MISCELLANEOUS) ×4 IMPLANT
DRSG COVADERM 4X10 (GAUZE/BANDAGES/DRESSINGS) ×2 IMPLANT
ELECT REM PT RETURN 9FT ADLT (ELECTROSURGICAL) ×2
ELECTRODE REM PT RTRN 9FT ADLT (ELECTROSURGICAL) ×1 IMPLANT
EXTRACTOR VACUUM M CUP 4 TUBE (SUCTIONS) IMPLANT
GLOVE BIO SURGEON STRL SZ 6.5 (GLOVE) ×2 IMPLANT
GLOVE BIO SURGEON STRL SZ7 (GLOVE) ×2 IMPLANT
GOWN PREVENTION PLUS LG XLONG (DISPOSABLE) ×6 IMPLANT
KIT ABG SYR 3ML LUER SLIP (SYRINGE) IMPLANT
NEEDLE HYPO 25X5/8 SAFETYGLIDE (NEEDLE) IMPLANT
NS IRRIG 1000ML POUR BTL (IV SOLUTION) ×2 IMPLANT
PACK C SECTION WH (CUSTOM PROCEDURE TRAY) ×2 IMPLANT
RTRCTR C-SECT PINK 25CM LRG (MISCELLANEOUS) ×2 IMPLANT
SLEEVE SCD COMPRESS KNEE MED (MISCELLANEOUS) ×2 IMPLANT
STAPLER VISISTAT 35W (STAPLE) ×2 IMPLANT
SUT MNCRL 0 VIOLET CTX 36 (SUTURE) ×2 IMPLANT
SUT MONOCRYL 0 CTX 36 (SUTURE) ×2
SUT PLAIN 1 NONE 54 (SUTURE) ×2 IMPLANT
SUT PLAIN 2 0 (SUTURE) ×2
SUT PLAIN 2 0 XLH (SUTURE) IMPLANT
SUT PLAIN ABS 2-0 CT1 27XMFL (SUTURE) ×1 IMPLANT
SUT VIC AB 0 CT1 27 (SUTURE) ×6
SUT VIC AB 0 CT1 27XBRD ANBCTR (SUTURE) ×3 IMPLANT
SUT VIC AB 2-0 CT1 27 (SUTURE) ×2
SUT VIC AB 2-0 CT1 TAPERPNT 27 (SUTURE) ×1 IMPLANT
SYR BULB IRRIGATION 50ML (SYRINGE) IMPLANT
TOWEL OR 17X24 6PK STRL BLUE (TOWEL DISPOSABLE) ×4 IMPLANT
TRAY FOLEY CATH 14FR (SET/KITS/TRAYS/PACK) ×2 IMPLANT
WATER STERILE IRR 1000ML POUR (IV SOLUTION) IMPLANT

## 2011-01-15 NOTE — Consult Note (Signed)
Neonatology Note:   Attendance at C-section:    I was asked to attend this repeat C/S at term in labor. The mother is a G2P1 O pos, GBS pos with onset of labor tonight, elevated BP, and moderate meconium. The mother received Clindamycin and Gentamicin shortly PTD.  Infant vigorous with good spontaneous cry and tone. Needed only minimal bulb suctioning. Ap 9/9. Lungs clear to ausc in DR. To CN to care of Pediatrician.   Deatra James, MD

## 2011-01-15 NOTE — ED Provider Notes (Signed)
Dawn Dalton is a 40 y.o. female presenting for R/O ROM at ~2015. She reports having a small gush of yellow-brown fluid that ran down her legs and frequent, mod-strong UC's since. She it scheduled for a repeat C/S on 01/18/11. She also reports generalized mild abd pain today and denied HA or vision changes. History OB History    Grav Para Term Preterm Abortions TAB SAB Ect Mult Living   2 1 1  0 0 0 0 0 0 1     Past Medical History  Diagnosis Date  . GERD (gastroesophageal reflux disease)     zantac otc, pregnancy related  . Seasonal allergies   . Headache   . Edema end of pregnancy    generalized edema-pt states was seen in Dr Berenda Morale office 8/15-elevated b/p , h/a, worked up for preeclampsia   Past Surgical History  Procedure Date  . No past surgeries   . Cesarean section 2005   Family History: family history is not on file. Social History:  reports that she has never smoked. She has never used smokeless tobacco. She reports that she does not drink alcohol or use illicit drugs.  ROS    Blood pressure 162/120, pulse 96, temperature 98.7 F (37.1 C), temperature source Oral, resp. rate 18. Maternal Exam:  Uterine Assessment: Contraction strength is firm.  Contraction duration is 60 seconds. Contraction frequency is regular.   Abdomen: Surgical scars: low transverse.   Fundal height is S=D.   Fetal presentation: vertex  Introitus: Normal vulva. Vagina is positive for vaginal discharge.  Ferning test: positive.  Nitrazine test: not done. Amniotic fluid character: meconium stained.  Pelvis: adequate for delivery.   Cervix: Cervix evaluated by sterile speculum exam and digital exam.     Fetal Exam Fetal Monitor Review: Mode: ultrasound.   Baseline rate: 140.  Variability: moderate (6-25 bpm).   Pattern: accelerations present and no decelerations.    Fetal State Assessment: Category I - tracings are normal.     Physical Exam  Constitutional: She is oriented to  person, place, and time. She appears well-developed and well-nourished. She appears distressed.  Cardiovascular: Normal rate.   Respiratory: Effort normal.  GI: Soft. There is no tenderness.  Genitourinary: Uterus normal. No bleeding around the vagina. Injury: mod amount of green mucus, neg pooling. Vaginal discharge found.  Musculoskeletal: Normal range of motion. She exhibits edema (3+ pedal edema).  Neurological: She is alert and oriented to person, place, and time. She has normal reflexes.       Neg clonus  Skin: Skin is warm and dry.  Psychiatric:       anxious     SVE: FT, long, -3, ?vtx Prenatal labs: ABO, Rh: O/Positive/-- (01/30 0000) Antibody: Negative (01/30 0000) Rubella:   RPR: NON REACTIVE (08/17 1150)  HBsAg: Negative (01/30 0000)  HIV: Non-reactive (01/30 0000)  GBS:   Pos in urine  Assessment/Plan: Assessment: 1. SROM MSF  2. Early labor, extremely uncomfortable 3. For planned RLTCS 4. FHR category I 5. Gest HTN vs Pre-eclampsia 6. GBS pos in urine  Plan: 1. Admit for RLTCS per consult w/ Dr. Ellyn Hack 2. PIH labs 3. Serial BPs 4. Prep for OR 5. Prophylaxis for GBS per Dr. Cheyenne Adas, [place] 01/15/2011, 9:48 PM

## 2011-01-15 NOTE — Progress Notes (Signed)
"  I was getting out of the car and I felt a gush of fluid that ran down my legs. It was greenish, brown, creamy liquid.  Nothing has happened since."

## 2011-01-15 NOTE — H&P (Signed)
Dawn Dalton is a 40 y.o. female presenting for rLTCS/BTL. Maternal Medical History:  Reason for admission: Reason for admission: rupture of membranes and contractions.    OB History    Grav Para Term Preterm Abortions TAB SAB Ect Mult Living   2 1 1  0 0 0 0 0 0 1    G! 2005 6#10 c/s; G2 present NVP, small fibroids, desires BTL; h/o abnormal pap, no STDs Past Medical History  Diagnosis Date  . GERD (gastroesophageal reflux disease)     zantac otc, pregnancy related  . Seasonal allergies   . Headache   . Edema end of pregnancy    generalized edema-pt states was seen in Dr Berenda Morale office 8/15-elevated b/p , h/a, worked up for preeclampsia  Depression - no meds for years Past Surgical History  Procedure Date  . No past surgeries   . Cesarean section 2005  LEEP, dental Family History:depression, HTN, Kidney dz, kidney CA, MI, Migraines, schizophrenia, skin CA, TIA, cousin with Down;s syndrome Social History:  reports that she has never smoked. She has never used smokeless tobacco. 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   HENT: Negative.   Eyes: Negative.   Respiratory: Negative.   Cardiovascular: Negative.   Gastrointestinal: Negative.   Genitourinary: Negative.   Musculoskeletal: Negative.   Skin: Negative.   Neurological: Negative.   Endo/Heme/Allergies: Negative.   Psychiatric/Behavioral: Negative.       Blood pressure 162/120, pulse 96, temperature 98.7 F (37.1 C), temperature source Oral, resp. rate 18. Exam Physical Exam  Constitutional: She is oriented to person, place, and time. She appears well-developed and well-nourished.  HENT:  Head: Normocephalic and atraumatic.  Neck: Normal range of motion. Neck supple. No thyromegaly present.  Cardiovascular: Normal rate and regular rhythm.   Respiratory: Effort normal and breath sounds normal.  GI: Soft. Bowel sounds are normal.    Musculoskeletal: Normal range of motion.  Neurological: She is alert and oriented to person, place, and time.  Skin: Skin is warm.  Psychiatric: She has a normal mood and affect.    Prenatal labs: ABO, Rh: O/Positive/-- (01/30 0000) Antibody: Negative (01/30 0000) Rubella:  Immune RPR: NON REACTIVE (08/17 1150)  HBsAg: Negative (01/30 0000)  HIV: Non-reactive (01/30 0000)  GBS:  positive glucola 142, nl 3hr GTT, Plt 274K, GC neg, Chl neg, declined genetic screening  US 7+4, dated by [place] Anat [place] nl anat, female, small fibroids, posterior plac   Assessment/Plan: 40yo G2P1 at 38+ with SROM for rLTCS and BTL, d/w pt R/B/A of LTCS and BTL Will monitor BP closely, PIH labs    BOVARD,Shannara Winbush 01/15/2011, 9:28 PM

## 2011-01-15 NOTE — Anesthesia Preprocedure Evaluation (Addendum)
Anesthesia Evaluation  Name, MR# and DOB Patient awake  General Assessment Comment  Reviewed: Allergy & Precautions, H&P , NPO status , Patient's Chart, lab work & pertinent test results, reviewed documented beta blocker date and time   History of Anesthesia Complications Negative for: history of anesthetic complications  Airway Mallampati: I TM Distance: >3 FB Neck ROM: full    Dental  (+) Teeth Intact   Pulmonary  clear to auscultation  breath sounds clear to auscultation none    Cardiovascular hypertension (elev BP x2 weeks, had pre-e w/u which was neg), regular Normal    Neuro/Psych   Headaches (recent migraine, has had HAs x3 days)   Negative Psych ROS  GI/Hepatic/Renal   negative Liver ROS  negative Renal ROS   GERD Medicated and Poorly Controlled     Endo/Other  Negative Endocrine ROS (+)      Abdominal   Musculoskeletal negative musculoskeletal ROS (+)   Hematology negative hematology ROS (+)   Peds  Reproductive/Obstetrics (+) Pregnancy    Anesthesia Other Findings             Anesthesia Physical Anesthesia Plan  ASA: III  Anesthesia Plan: Spinal   Post-op Pain Management:    Induction:   Airway Management Planned:   Additional Equipment:   Intra-op Plan:   Post-operative Plan:   Informed Consent: I have reviewed the patients History and Physical, chart, labs and discussed the procedure including the risks, benefits and alternatives for the proposed anesthesia with the patient or authorized representative who has indicated his/her understanding and acceptance.     Plan Discussed with: CRNA and Surgeon  Anesthesia Plan Comments:         Anesthesia Quick Evaluation

## 2011-01-15 NOTE — Anesthesia Procedure Notes (Signed)
Spinal Block  Patient location during procedure: OR Start time: 01/15/2011 10:31 PM Staffing Performed by: anesthesiologist  Preanesthetic Checklist Completed: patient identified, site marked, surgical consent, pre-op evaluation, timeout performed, IV checked, risks and benefits discussed and monitors and equipment checked Spinal Block Patient position: sitting Prep: site prepped and draped and DuraPrep Patient monitoring: heart rate, cardiac monitor, continuous pulse ox and blood pressure Approach: midline Location: L3-4 Injection technique: single-shot Needle Needle type: Sprotte  Needle gauge: 24 G Needle length: 5 cm Assessment Sensory level: T4

## 2011-01-15 NOTE — Transfer of Care (Signed)
  Anesthesia Post-op Note  Patient: Dawn Dalton  Procedure(s) Performed:  CESAREAN SECTION WITH BILATERAL TUBAL LIGATION - Repeat Cesarean Section with Delivery Baby Boy @ 2247, Apgars 9/9; Bilateral Tubal Ligation  Patient Location: PACU  Anesthesia Type: Spinal  Level of Consciousness: awake, alert  and oriented  Airway and Oxygen Therapy: Patient Spontanous Breathing  Post-op Pain: none  Post-op Assessment: Post-op Vital signs reviewed, Patient's Cardiovascular Status Stable and Respiratory Function Stable  Post-op Vital Signs: stable  Complications: No apparent anesthesia complications

## 2011-01-15 NOTE — Anesthesia Postprocedure Evaluation (Signed)
Anesthesia Post Note  Patient: Dawn Dalton  Procedure(s) Performed:  CESAREAN SECTION WITH BILATERAL TUBAL LIGATION - Repeat Cesarean Section with Delivery Baby Boy @ 2247, Apgars 9/9; Bilateral Tubal Ligation  Anesthesia type: Epidural  Patient location: PACU  Post pain: Pain level controlled  Post assessment: Post-op Vital signs reviewed  Last Vitals:  Filed Vitals:   01/15/11 2345  BP: 125/88  Pulse: 81  Temp:   Resp: 17    Post vital signs: Reviewed  Level of consciousness: awake  Complications: No apparent anesthesia complications

## 2011-01-15 NOTE — Plan of Care (Signed)
Spoke to dr Rodman Pickle about patient. Informed her that patient a total of 300mg  po zantac at 2000pm. Order not to administer the pepcid 20mg  ivpiggyback. Just give bictra po.

## 2011-01-16 ENCOUNTER — Encounter (HOSPITAL_COMMUNITY): Payer: Self-pay | Admitting: *Deleted

## 2011-01-16 LAB — COMPREHENSIVE METABOLIC PANEL
BUN: 12 mg/dL (ref 6–23)
CO2: 24 mEq/L (ref 19–32)
Calcium: 9 mg/dL (ref 8.4–10.5)
Creatinine, Ser: 0.73 mg/dL (ref 0.50–1.10)
GFR calc Af Amer: >60 mL/min (ref >60)
GFR calc non Af Amer: >60 mL/min (ref >60)
Glucose, Bld: 81 mg/dL (ref 70–99)

## 2011-01-16 LAB — CBC
HCT: 34.5 % — ABNORMAL LOW (ref 36.0–46.0)
MCH: 29 pg (ref 26.0–34.0)
MCHC: 32.8 g/dL (ref 30.0–36.0)
RDW: 13.5 % (ref 11.5–15.5)

## 2011-01-16 LAB — ABO/RH: ABO/RH(D): O POS

## 2011-01-16 LAB — URINALYSIS, ROUTINE W REFLEX MICROSCOPIC
Ketones, ur: NEGATIVE mg/dL
Leukocytes, UA: NEGATIVE
Nitrite: NEGATIVE
Protein, ur: 30 mg/dL — AB
pH: 7 (ref 5.0–8.0)

## 2011-01-16 LAB — URINE MICROSCOPIC-ADD ON

## 2011-01-16 LAB — RPR: RPR Ser Ql: NONREACTIVE

## 2011-01-16 LAB — LACTATE DEHYDROGENASE: LDH: 223 U/L (ref 94–250)

## 2011-01-16 MED ORDER — NALBUPHINE SYRINGE 5 MG/0.5 ML
5.0000 mg | INJECTION | INTRAMUSCULAR | Status: DC | PRN
  Filled 2011-01-16: qty 1

## 2011-01-16 MED ORDER — SODIUM CHLORIDE 0.9 % IJ SOLN
3.0000 mL | INTRAMUSCULAR | Status: DC | PRN
  Administered 2011-01-17: 3 mL via INTRAVENOUS

## 2011-01-16 MED ORDER — DIPHENHYDRAMINE HCL 25 MG PO CAPS
25.0000 mg | ORAL_CAPSULE | Freq: Four times a day (QID) | ORAL | Status: DC | PRN
  Administered 2011-01-16: 25 mg via ORAL
  Filled 2011-01-16: qty 1

## 2011-01-16 MED ORDER — IBUPROFEN 800 MG PO TABS
800.0000 mg | ORAL_TABLET | Freq: Three times a day (TID) | ORAL | Status: DC
  Administered 2011-01-16 – 2011-01-17 (×6): 800 mg via ORAL
  Filled 2011-01-16 (×5): qty 1

## 2011-01-16 MED ORDER — METOCLOPRAMIDE HCL 5 MG/ML IJ SOLN: 10.0000 mg | Freq: Three times a day (TID) | INTRAMUSCULAR | Status: DC | PRN

## 2011-01-16 MED ORDER — TETANUS-DIPHTH-ACELL PERTUSSIS 5-2.5-18.5 LF-MCG/0.5 IM SUSP
0.5000 mL | Freq: Once | INTRAMUSCULAR | Status: AC
  Administered 2011-01-17: 0.5 mL via INTRAMUSCULAR
  Filled 2011-01-16: qty 0.5

## 2011-01-16 MED ORDER — SIMETHICONE 80 MG PO CHEW
80.0000 mg | CHEWABLE_TABLET | ORAL | Status: DC | PRN
  Administered 2011-01-18: 80 mg via ORAL

## 2011-01-16 MED ORDER — SIMETHICONE 80 MG PO CHEW
80.0000 mg | CHEWABLE_TABLET | Freq: Three times a day (TID) | ORAL | Status: DC
  Administered 2011-01-16 – 2011-01-18 (×7): 80 mg via ORAL

## 2011-01-16 MED ORDER — ONDANSETRON HCL 4 MG PO TABS
4.0000 mg | ORAL_TABLET | ORAL | Status: DC | PRN
  Administered 2011-01-18: 4 mg via ORAL
  Filled 2011-01-16: qty 1

## 2011-01-16 MED ORDER — NALOXONE HCL 0.4 MG/ML IJ SOLN: 0.4000 mg | INTRAMUSCULAR | Status: DC | PRN

## 2011-01-16 MED ORDER — MAGNESIUM SULFATE 40 G IN LACTATED RINGERS - SIMPLE
2.0000 g/h | INTRAVENOUS | Status: DC
  Administered 2011-01-16 (×2): 2 g/h via INTRAVENOUS
  Filled 2011-01-16 (×2): qty 500

## 2011-01-16 MED ORDER — SODIUM CHLORIDE 0.9 % IV SOLN
1.0000 ug/kg/h | INTRAVENOUS | Status: DC | PRN
  Filled 2011-01-16: qty 2.5

## 2011-01-16 MED ORDER — IBUPROFEN 600 MG PO TABS: 600.0000 mg | ORAL_TABLET | Freq: Four times a day (QID) | ORAL | Status: DC | PRN

## 2011-01-16 MED ORDER — FAMOTIDINE 20 MG PO TABS
20.0000 mg | ORAL_TABLET | Freq: Every day | ORAL | Status: DC
  Administered 2011-01-16 – 2011-01-18 (×3): 20 mg via ORAL
  Filled 2011-01-16 (×3): qty 1

## 2011-01-16 MED ORDER — SENNOSIDES-DOCUSATE SODIUM 8.6-50 MG PO TABS
2.0000 | ORAL_TABLET | Freq: Every day | ORAL | Status: DC
  Administered 2011-01-16 – 2011-01-17 (×2): 2 via ORAL

## 2011-01-16 MED ORDER — KETOROLAC TROMETHAMINE 60 MG/2ML IM SOLN
INTRAMUSCULAR | Status: AC
  Administered 2011-01-16: 60 mg via INTRAMUSCULAR
  Filled 2011-01-16: qty 2

## 2011-01-16 MED ORDER — DIPHENHYDRAMINE HCL 25 MG PO CAPS: 25.0000 mg | ORAL_CAPSULE | ORAL | Status: DC | PRN

## 2011-01-16 MED ORDER — ZOLPIDEM TARTRATE 5 MG PO TABS
5.0000 mg | ORAL_TABLET | Freq: Every evening | ORAL | Status: DC | PRN
  Administered 2011-01-16 – 2011-01-17 (×2): 5 mg via ORAL
  Filled 2011-01-16 (×2): qty 1

## 2011-01-16 MED ORDER — DIPHENHYDRAMINE HCL 50 MG/ML IJ SOLN
INTRAMUSCULAR | Status: AC
  Administered 2011-01-16: 12.5 mg via INTRAVENOUS
  Filled 2011-01-16: qty 1

## 2011-01-16 MED ORDER — MENTHOL 3 MG MT LOZG: 1.0000 | LOZENGE | OROMUCOSAL | Status: DC | PRN

## 2011-01-16 MED ORDER — PRENATAL PLUS 27-1 MG PO TABS
1.0000 | ORAL_TABLET | Freq: Every day | ORAL | Status: DC
  Administered 2011-01-18: 1 via ORAL
  Filled 2011-01-16 (×3): qty 1

## 2011-01-16 MED ORDER — LANOLIN HYDROUS EX OINT: 1.0000 "application " | TOPICAL_OINTMENT | CUTANEOUS | Status: DC | PRN

## 2011-01-16 MED ORDER — LACTATED RINGERS IV SOLN
INTRAVENOUS | Status: DC
  Administered 2011-01-16 – 2011-01-17 (×3): via INTRAVENOUS

## 2011-01-16 MED ORDER — DIBUCAINE 1 % RE OINT: 1.0000 "application " | TOPICAL_OINTMENT | RECTAL | Status: DC | PRN

## 2011-01-16 MED ORDER — DIPHENHYDRAMINE HCL 50 MG/ML IJ SOLN: 25.0000 mg | INTRAMUSCULAR | Status: DC | PRN

## 2011-01-16 MED ORDER — KETOROLAC TROMETHAMINE 30 MG/ML IJ SOLN: 30.0000 mg | Freq: Four times a day (QID) | INTRAMUSCULAR | Status: AC | PRN

## 2011-01-16 MED ORDER — WITCH HAZEL-GLYCERIN EX PADS: 1.0000 "application " | MEDICATED_PAD | CUTANEOUS | Status: DC | PRN

## 2011-01-16 MED ORDER — DIPHENHYDRAMINE HCL 50 MG/ML IJ SOLN
12.5000 mg | INTRAMUSCULAR | Status: DC | PRN
  Administered 2011-01-16: 12.5 mg via INTRAVENOUS

## 2011-01-16 MED ORDER — SCOPOLAMINE 1 MG/3DAYS TD PT72
MEDICATED_PATCH | TRANSDERMAL | Status: AC
  Administered 2011-01-16: 1.5 mg via TRANSDERMAL
  Filled 2011-01-16: qty 1

## 2011-01-16 MED ORDER — ONDANSETRON HCL 4 MG/2ML IJ SOLN: 4.0000 mg | Freq: Three times a day (TID) | INTRAMUSCULAR | Status: DC | PRN

## 2011-01-16 MED ORDER — MEPERIDINE HCL 25 MG/ML IJ SOLN
INTRAMUSCULAR | Status: AC
Start: 2011-01-16 — End: 2011-01-16
  Administered 2011-01-16: 6.25 mg via INTRAVENOUS
  Filled 2011-01-16: qty 1

## 2011-01-16 MED ORDER — LORATADINE 10 MG PO TABS
10.0000 mg | ORAL_TABLET | Freq: Every day | ORAL | Status: DC
  Filled 2011-01-16 (×4): qty 1

## 2011-01-16 MED ORDER — HYDROCHLOROTHIAZIDE 25 MG PO TABS
25.0000 mg | ORAL_TABLET | Freq: Every day | ORAL | Status: DC
  Administered 2011-01-16 – 2011-01-18 (×3): 25 mg via ORAL
  Filled 2011-01-16 (×4): qty 1

## 2011-01-16 MED ORDER — OXYCODONE-ACETAMINOPHEN 5-325 MG PO TABS
1.0000 | ORAL_TABLET | ORAL | Status: DC | PRN
  Administered 2011-01-16: 1 via ORAL
  Administered 2011-01-16: 2 via ORAL
  Administered 2011-01-16 (×2): 1 via ORAL
  Administered 2011-01-17: 2 via ORAL
  Administered 2011-01-17: 1 via ORAL
  Administered 2011-01-18: 2 via ORAL
  Filled 2011-01-16: qty 1
  Filled 2011-01-16 (×2): qty 2
  Filled 2011-01-16 (×2): qty 1
  Filled 2011-01-16 (×2): qty 2
  Filled 2011-01-16: qty 1

## 2011-01-16 MED ORDER — OXYTOCIN 20 UNITS IN LACTATED RINGERS INFUSION - SIMPLE
INTRAVENOUS | Status: AC
  Administered 2011-01-16: 125 mL/h via INTRAVENOUS
  Filled 2011-01-16: qty 1000

## 2011-01-16 MED ORDER — MAGNESIUM SULFATE BOLUS VIA INFUSION
4.0000 g | Freq: Once | INTRAVENOUS | Status: DC
  Administered 2011-01-16: 4 g via INTRAVENOUS
  Filled 2011-01-16: qty 500

## 2011-01-16 MED ORDER — CALCIUM CARBONATE ANTACID 500 MG PO CHEW: 2.0000 | CHEWABLE_TABLET | ORAL | Status: DC | PRN

## 2011-01-16 MED ORDER — ONDANSETRON HCL 4 MG/2ML IJ SOLN: 4.0000 mg | INTRAMUSCULAR | Status: DC | PRN

## 2011-01-16 NOTE — Plan of Care (Signed)
Problem: Phase II Progression Outcomes Goal: Other Phase II Outcomes/Goals PIH S&S resolving-Magnesium gtt d/c'd

## 2011-01-16 NOTE — Anesthesia Postprocedure Evaluation (Signed)
  Anesthesia Post-op Note  Patient: Dawn Dalton  Procedure(s) Performed:  CESAREAN SECTION WITH BILATERAL TUBAL LIGATION - Repeat Cesarean Section with Delivery Baby Boy @ 2247, Apgars 9/9; Bilateral Tubal Ligation  Patient Location: PACU and A-ICU  Anesthesia Type: Spinal  Level of Consciousness: awake, alert  and oriented  Airway and Oxygen Therapy: Patient Spontanous Breathing  Post-op Pain: mild  Post-op Assessment: Patient's Cardiovascular Status Stable, Respiratory Function Stable, Patent Airway and Pain level controlled  Post-op Vital Signs: stable  Complications: No apparent anesthesia complications

## 2011-01-16 NOTE — Progress Notes (Addendum)
Subjective: Postpartum Day 1: Cesarean Delivery/BTL Patient reports incisional pain and tolerating PO, c/o blurry vision, hot, dry throat - d/w pt Magnesium side effects.    Objective: Vital signs in last 24 hours: Temp:  [97.8 F (36.6 C)-98.7 F (37.1 C)] 97.9 F (36.6 C) (08/26 0800) Pulse Rate:  [81-112] 112  (08/26 1000) Resp:  [14-26] 20  (08/26 0800) BP: (120-170)/(69-120) 148/94 mmHg (08/26 1015) SpO2:  [95 %-98 %] 97 % (08/26 1000) Weight:  [87.499 kg (192 lb 14.4 oz)] 192 lb 14.4 oz (87.499 kg) (08/26 0145)  Physical Exam:  General: alert and no distress Lochia: appropriate Uterine Fundus: firm Incision: bandage intact DVT Evaluation: No evidence of DVT seen on physical exam.   Basename 01/16/11 0529 01/15/11 2144  HGB 11.3* 11.4*  HCT 34.5* 33.5*    Assessment/Plan: Status post Cesarean section/BTL. Doing well postoperatively.  Continue current care - magnesium for 24 hours - PIH labs in AM, BP elevated despite Mag, will start BP meds - HCTZ, not breastfeeding.  BOVARD,Errica Dutil 01/16/2011, 10:52 AM

## 2011-01-16 NOTE — Op Note (Signed)
NAME:  QUANTA, ROBERTSHAW NO.:  [id number]  MEDICAL RECORD NO.:  [id number]  LOCATION:  9372                          FACILITY:  [facility]  PHYSICIAN:  Sherron Monday, MD        DATE OF BIRTH:  12-19-1970  DATE OF PROCEDURE:  01/15/2011 DATE OF DISCHARGE:                              OPERATIVE REPORT   PREOPERATIVE DIAGNOSIS:  Intrauterine pregnancy at 38 plus weeks, spontaneous rupture of membranes, undesired facility, declines vaginal birth after cesarean.  POSTOPERATIVE DIAGNOSIS:  Intrauterine pregnancy at 38 plus weeks, spontaneous rupture of membranes, undesired facility, declines vaginal birth after cesarean, delivered.  PROCEDURE:  Repeat low transverse cesarean section, bilateral tubal ligation by [place] method.  SURGEON:  Sherron Monday, MD  ANESTHESIA:  Spinal.  FINDINGS:  Viable female infant at 2047 with Apgars of 9 at 1 minute and 9 at 5 minutes and a weight of 7 pounds 2 ounces, moderate meconium and nuchal cord noted, normal uterus, tubes, and ovaries.  COMPLICATIONS:  None.  PATHOLOGY:  Placenta to L and D, bilateral tubal segments to pathology.  DISPOSITION:  Stable to PACU following the procedure.  PROCEDURE:  The patient had confirmed rupture of membranes with meconium noted in the Maternity Admission Unit.  At this point, we discussed the risks, benefits, and alternatives of repeat low transverse cesarean section as well as bilateral tubal ligation including but not limited to bleeding, infection, damage to surrounding organs as well as failure of the tubal with a resulting ectopic pregnancy.  She voiced understanding and wished to proceed.  She was transferred to the OR where spinal anesthesia was induced, found to be adequate.  She was then placed in supine position with a leftward tilt.  She was sterilely prepped and draped and a catheter was sterilely placed in her bladder.  Pfannenstiel skin incision was made at the level of her previous  skin incision, carried through the underlying layer of fascia sharply.  The fascia was incised in the midline and the incision was extended laterally with Mayo scissors.  Inferior aspect of fascial incision was grasped with Kocher clamps, elevated.  The rectus muscles were dissected off bluntly and sharply.  Attention was then turned to the superior portion of the fascial incision and in a similar fashion was grasped with Kocher clamps, elevated, and the rectus muscles were dissected off both bluntly and sharply.  Midline was easily identified and with aid of hemostats, it was entered sharply.  The incision was extended superiorly and inferiorly with good visualization of the bladder.  The Alexis skin retractor was placed carefully checking to make sure that no bowel was entrapped.  The vesicouterine peritoneum was easily identified, tented up with some pickups and a bladder flap was created both digitally and sharply.  Uterus was incised in a transverse fashion and the infant was delivered from vertex presentation.  Nose and mouth were suctioned on the field.  Cord was clamped and cut.  The infant was handed off to the awaiting pediatric staff.  The placenta was expressed and the uterus was cleared of all clot and debris.  The uterine incision was closed with 2 layers of 0  Monocryl, the first of which was running locked and the second as an imbricating layer.  Hemostasis was assured.  Attention was turned to performing the tubal.  The tube was easily identified, followed out to the fimbriated end of the left.  The tube was tented up with Babcock.  In the mesosalpinx, a hole was made using Bovie cautery. The tubal was performed in the Park[place] fashion excising the intervening portions sending that to pathology.  Hemostasis was assured.  Attention was then turned to the right tube which in a similar fashion was tented up with the Babcock and an avascular window was seen in the  mesosalpinx and a window was made with Bovie cautery.  The tube was tied in a Parkland fashion.  Hemostasis was again assured.  The uterine incision was made hemostatic with Bovie cautery and figure-of-eight of 0 Vicryl. The pelvis had copious irrigation.  The Alexis retractor was removed. The peritoneum was reapproximated and the rectus muscles with 2-0 Vicryl.  The subfascial planes were made hemostatic as were the rectus muscles with Bovie cautery.  The fascia was reapproximated using 0 Vicryl in a running fashion.  Subcuticular adipose layer was made hemostatic with Bovie cautery and the dead space was closed with 2-0 plain gut.  Skin was closed with staples.  The patient tolerated the procedure well.  Sponge, lap, and needle count were correct x2 per the operating room staff at the end of the procedure.     Sherron Monday, MD     JB/MEDQ  D:  01/15/2011  T:  01/16/2011  Job:  [id number]

## 2011-01-17 ENCOUNTER — Encounter (HOSPITAL_COMMUNITY): Payer: Self-pay | Admitting: Obstetrics and Gynecology

## 2011-01-17 DIAGNOSIS — O1490 Unspecified pre-eclampsia, unspecified trimester: Secondary | ICD-10-CM

## 2011-01-17 HISTORY — DX: Unspecified pre-eclampsia, unspecified trimester: O14.90

## 2011-01-17 LAB — COMPREHENSIVE METABOLIC PANEL
ALT: 23 U/L (ref 0–35)
AST: 28 U/L (ref 0–37)
Albumin: 2.2 g/dL — ABNORMAL LOW (ref 3.5–5.2)
CO2: 28 mEq/L (ref 19–32)
Calcium: 6.3 mg/dL — CL (ref 8.4–10.5)
Creatinine, Ser: 0.81 mg/dL (ref 0.50–1.10)
Sodium: 137 mEq/L (ref 135–145)
Total Protein: 5.1 g/dL — ABNORMAL LOW (ref 6.0–8.3)

## 2011-01-17 LAB — CBC
MCH: 29.6 pg (ref 26.0–34.0)
MCV: 89.6 fL (ref 78.0–100.0)
Platelets: 202 10*3/uL (ref 150–400)
RBC: 3.55 MIL/uL — ABNORMAL LOW (ref 3.87–5.11)
RDW: 13.6 % (ref 11.5–15.5)

## 2011-01-17 NOTE — Progress Notes (Signed)
UR chart review completed.  

## 2011-01-17 NOTE — Progress Notes (Signed)
Pt assisted to bathroom for foley removal and pericare/peripad change.  While sitting on toilet pt stated she felt like she was going to faint; stated her vision started changing and fingers felt tingly.  Pt encouraged to inhale ammonia to prevent syncopal episode.  NT and RN from [facility] called to assist w/pt.  Pt placed in stedy to get her safely back to bed.  Pt remained conscious but stated she felt really bad.  Vital signs taken:  BP 123/90, HR 92, O2 Sat 94%.  Apple juice and ice water given, cold rag placed over pt's forehead.  Will continue to monitor.

## 2011-01-17 NOTE — Progress Notes (Signed)
Subjective: Postpartum Day 2: Cesarean Delivery Patient reports incisional pain, tolerating PO and no problems voiding.  Nl lochia, pain controlled.  Still feels hot and didn't sleep well. Good diuresis  Objective: Vital signs in last 24 hours: Temp:  [97.7 F (36.5 C)-98 F (36.7 C)] 97.8 F (36.6 C) (08/27 0500) Pulse Rate:  [83-112] 89  (08/27 0600) Resp:  [16-20] 20  (08/27 0600) BP: (121-154)/(81-106) 130/90 mmHg (08/27 0600) SpO2:  [93 %-98 %] 93 % (08/27 0600) Weight:  [87.454 kg (192 lb 12.8 oz)] 192 lb 12.8 oz (87.454 kg) (08/27 0600)  Physical Exam:  General: alert and no distress Lochia: appropriate Uterine Fundus: firm Incision: healing well, bandage intact DVT Evaluation: No evidence of DVT seen on physical exam.   Basename 01/17/11 0515 01/16/11 0529  HGB 10.5* 11.3*  HCT 31.8* 34.5*  labs stable  Assessment/Plan: Status post Cesarean section. Doing well postoperatively.  D/c magnesium, transfer to postpartum bed, monitor BP closely, continue HCTZ.  BOVARD,Badr Piedra 01/17/2011, 8:17 AM

## 2011-01-18 ENCOUNTER — Encounter (HOSPITAL_COMMUNITY): Admission: RE | Payer: Self-pay | Source: Ambulatory Visit

## 2011-01-18 ENCOUNTER — Inpatient Hospital Stay (HOSPITAL_COMMUNITY)
Admission: RE | Admit: 2011-01-18 | Payer: BC Managed Care – PPO | Source: Ambulatory Visit | Admitting: Obstetrics and Gynecology

## 2011-01-18 SURGERY — Surgical Case
Anesthesia: Choice

## 2011-01-18 MED ORDER — OXYCODONE-ACETAMINOPHEN 5-325 MG PO TABS: 1.0000 | ORAL_TABLET | ORAL | Status: AC | PRN

## 2011-01-18 MED ORDER — IBUPROFEN 800 MG PO TABS: 800.0000 mg | ORAL_TABLET | Freq: Three times a day (TID) | ORAL | Status: AC

## 2011-01-18 MED ORDER — PRENATAL PLUS 27-1 MG PO TABS: 1.0000 | ORAL_TABLET | Freq: Every day | ORAL | Status: AC

## 2011-01-18 MED ORDER — HYDROCHLOROTHIAZIDE 25 MG PO TABS: 25.0000 mg | ORAL_TABLET | Freq: Every day | ORAL | Status: AC

## 2011-01-18 NOTE — Discharge Summary (Signed)
Obstetric Discharge Summary Reason for Admission: rupture of membranes Prenatal Procedures: none Intrapartum Procedures: cesarean: low cervical, transverse and tubal ligation Postpartum Procedures: none Complications-Operative and Postpartum: PIH, magnesium sulfate Hemoglobin  Date Value Range Status  01/17/2011 10.5* 12.0-15.0 (g/dL) Final     HCT  Date Value Range Status  01/17/2011 31.8* 36.0-46.0 (%) Final    Discharge Diagnoses: Term Pregnancy-delivered  Discharge Information: Date: 01/18/2011 Activity: pelvic rest Diet: routine Medications: PNV, Ibuprophen, Percocet and HCTZ Condition: stable Instructions: refer to practice specific booklet Discharge to: home Follow-up Information    Follow up with Dawn Dalton,Dawn Brisk, MD. Call in 2 weeks. (For Incision check and BP check)    Contact information:   [address] [facility] [address] [place] [address] [phone]          Newborn Data: Live born female  Birth Weight: 7 lb 1.8 oz (3226 g) APGAR: 9, 9  Home with mother.  Dawn Dalton,Dawn Dalton 01/18/2011, 8:55 AM

## 2011-01-18 NOTE — Progress Notes (Signed)
Subjective: Postpartum Day 3: Cesarean Delivery Patient reports incisional pain, tolerating PO and no problems voiding.  Nl lochia, pain controlled  Objective: Vital signs in last 24 hours: Temp:  [98.2 F (36.8 C)-98.5 F (36.9 C)] 98.4 F (36.9 C) (08/28 0525) Pulse Rate:  [106-109] 109  (08/28 0525) Resp:  [18-20] 18  (08/28 0525) BP: (115-137)/(79-96) 115/79 mmHg (08/28 0525) SpO2:  [99 %] 99 % (08/27 2030)  Physical Exam:  General: alert and no distress Lochia: appropriate Uterine Fundus: firm Incision: healing well DVT Evaluation: No evidence of DVT seen on physical exam.   Basename 01/17/11 0515 01/16/11 0529  HGB 10.5* 11.3*  HCT 31.8* 34.5*    Assessment/Plan: Status post Cesarean section. Doing well postoperatively.  Discharge home with standard precautions and return to clinic in 2 weeks. D/c with Motrin, Vicodin, PNV, HCTZ.  F/u 2 week for incision check and BP check  BOVARD,Chaim Gatley 01/18/2011, 8:26 AM

## 2011-02-28 LAB — POCT PREGNANCY, URINE: Operator id: 261601

## 2011-02-28 LAB — DIFFERENTIAL
Basophils Absolute: 0
Basophils Relative: 0
Lymphocytes Relative: 4 — ABNORMAL LOW
Neutro Abs: 8.1 — ABNORMAL HIGH

## 2011-02-28 LAB — URINALYSIS, ROUTINE W REFLEX MICROSCOPIC
Glucose, UA: NEGATIVE
Protein, ur: NEGATIVE
Specific Gravity, Urine: 1.031 — ABNORMAL HIGH
pH: 6

## 2011-02-28 LAB — CBC
MCHC: 35
RBC: 4.95
RDW: 12.5

## 2011-02-28 LAB — COMPREHENSIVE METABOLIC PANEL
ALT: 17
AST: 36
CO2: 19
Calcium: 9.1
GFR calc Af Amer: >60
Sodium: 138
Total Protein: 6.4

## 2011-10-27 ENCOUNTER — Other Ambulatory Visit: Payer: Self-pay | Admitting: Family Medicine

## 2011-10-27 DIAGNOSIS — Z1231 Encounter for screening mammogram for malignant neoplasm of breast: Secondary | ICD-10-CM

## 2011-11-02 ENCOUNTER — Ambulatory Visit
Admission: RE | Admit: 2011-11-02 | Discharge: 2011-11-02 | Disposition: A | Discharge status: Home / Self Care | Payer: BC Managed Care – PPO | Source: Ambulatory Visit | Attending: Family Medicine | Admitting: Family Medicine

## 2011-11-02 DIAGNOSIS — Z1231 Encounter for screening mammogram for malignant neoplasm of breast: Secondary | ICD-10-CM

## 2013-03-25 ENCOUNTER — Other Ambulatory Visit: Payer: Self-pay | Admitting: Family Medicine

## 2014-03-24 ENCOUNTER — Encounter (HOSPITAL_COMMUNITY): Payer: Self-pay | Admitting: Obstetrics and Gynecology

## 2014-08-14 ENCOUNTER — Other Ambulatory Visit: Payer: Self-pay | Admitting: Family Medicine

## 2014-08-14 ENCOUNTER — Other Ambulatory Visit (HOSPITAL_COMMUNITY)
Admission: RE | Admit: 2014-08-14 | Discharge: 2014-08-14 | Disposition: A | Discharge status: Home / Self Care | Payer: BC Managed Care – PPO | Source: Ambulatory Visit | Attending: Family Medicine | Admitting: Family Medicine

## 2014-08-14 DIAGNOSIS — Z124 Encounter for screening for malignant neoplasm of cervix: Secondary | ICD-10-CM | POA: Insufficient documentation

## 2014-08-20 LAB — CYTOLOGY - PAP

## 2015-08-31 ENCOUNTER — Other Ambulatory Visit: Payer: Self-pay | Admitting: Family Medicine

## 2015-08-31 ENCOUNTER — Ambulatory Visit
Admission: RE | Admit: 2015-08-31 | Discharge: 2015-08-31 | Disposition: A | Discharge status: Home / Self Care | Payer: BC Managed Care – PPO | Source: Ambulatory Visit | Attending: Family Medicine | Admitting: Family Medicine

## 2015-08-31 DIAGNOSIS — R1032 Left lower quadrant pain: Secondary | ICD-10-CM

## 2015-08-31 MED ORDER — IOPAMIDOL (ISOVUE-300) INJECTION 61%
100.0000 mL | Freq: Once | INTRAVENOUS | Status: AC | PRN
  Administered 2015-08-31: 100 mL via INTRAVENOUS

## 2015-10-08 ENCOUNTER — Other Ambulatory Visit: Payer: Self-pay | Admitting: Family Medicine

## 2015-10-08 ENCOUNTER — Other Ambulatory Visit (HOSPITAL_COMMUNITY)
Admission: RE | Admit: 2015-10-08 | Discharge: 2015-10-08 | Disposition: A | Discharge status: Home / Self Care | Payer: BC Managed Care – PPO | Source: Ambulatory Visit | Attending: Family Medicine | Admitting: Family Medicine

## 2015-10-08 DIAGNOSIS — Z124 Encounter for screening for malignant neoplasm of cervix: Secondary | ICD-10-CM | POA: Diagnosis present

## 2015-10-08 DIAGNOSIS — Z1151 Encounter for screening for human papillomavirus (HPV): Secondary | ICD-10-CM | POA: Insufficient documentation

## 2015-10-12 LAB — CYTOLOGY - PAP

## 2016-09-19 IMAGING — CT CT ABD-PELV W/ CM
2 of 5 series · 14 of 46 positions shown, 16 images · IV contrast (APPLIED)
Comparison: 10/04/2008

CLINICAL DATA: Mid abdominal pain for 1 week, left lower quadrant
pain for 9 days

EXAM:
CT ABDOMEN AND PELVIS WITH CONTRAST
TECHNIQUE: Multidetector CT imaging of the abdomen and pelvis was performed
using the standard protocol following bolus administration of
intravenous contrast.
CONTRAST:  100mL [id number] IOPAMIDOL ([id number]) INJECTION 61%

[Series 2: abd/pelvis w/cm · axial · 0.63mm/px · z∈[-526,-111]mm · 11 of 93 slices shown, 13 images]
[im 5/93  soft-tissue]
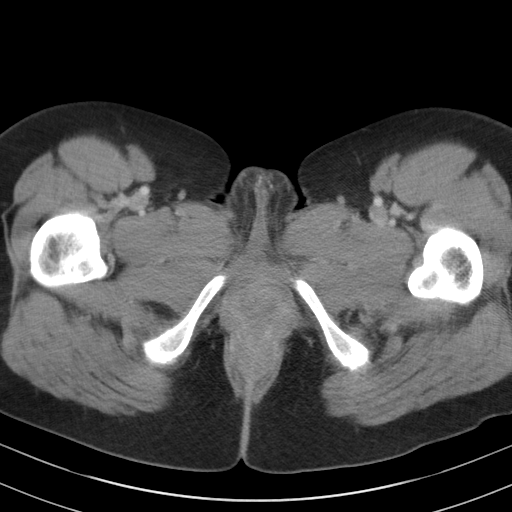
[im 5/93  bone]
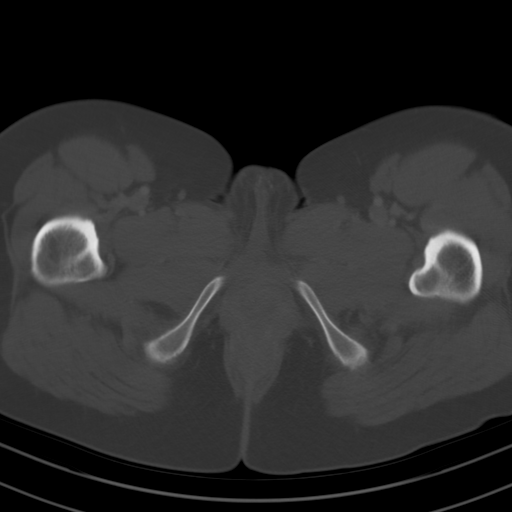
[im 15/93  soft-tissue]
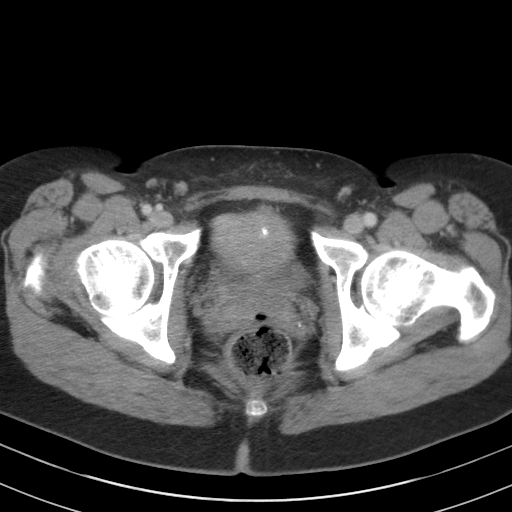
[im 25/93  soft-tissue]
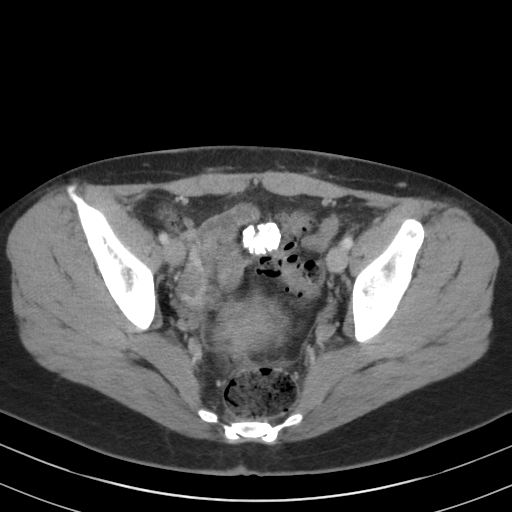
[im 30/93  soft-tissue]
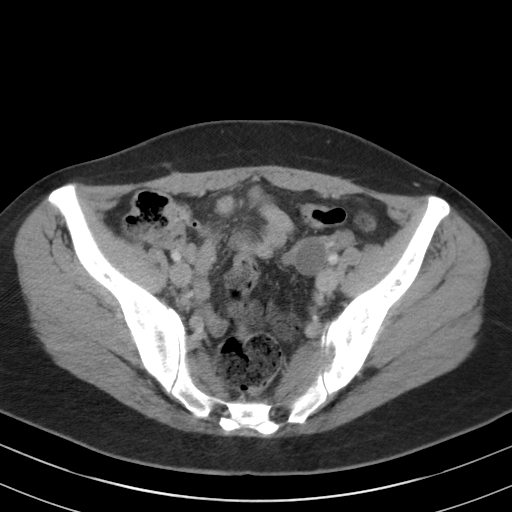
[im 39/93  soft-tissue]
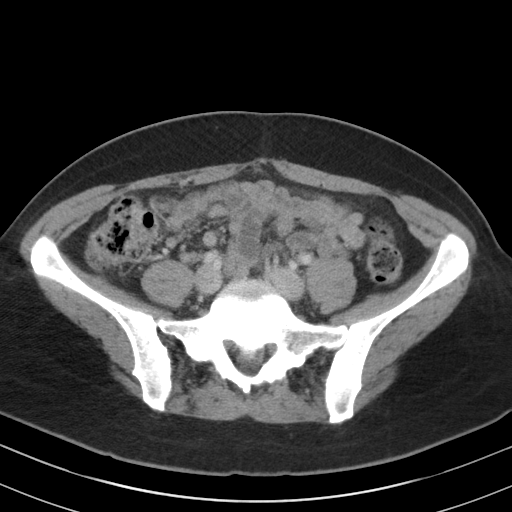
[im 49/93  soft-tissue]
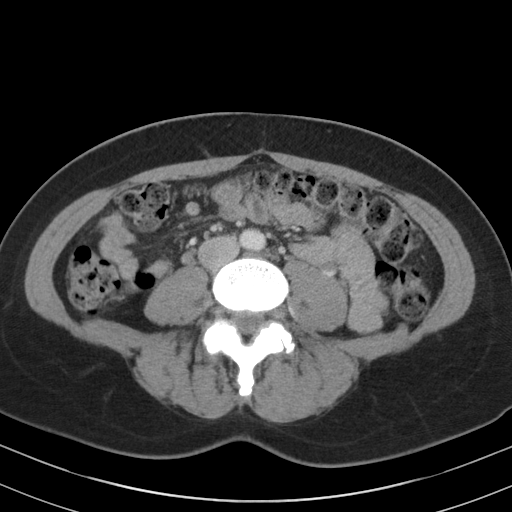
[im 54/93  soft-tissue]
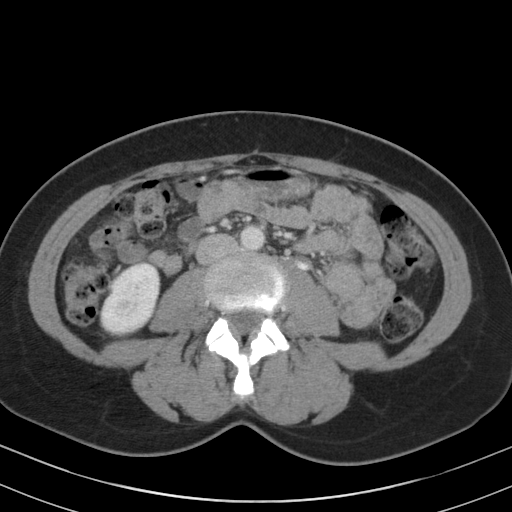
[im 63/93  soft-tissue]
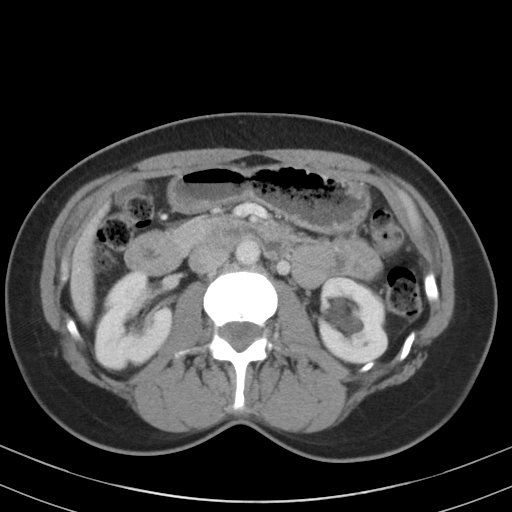
[im 68/93  soft-tissue]
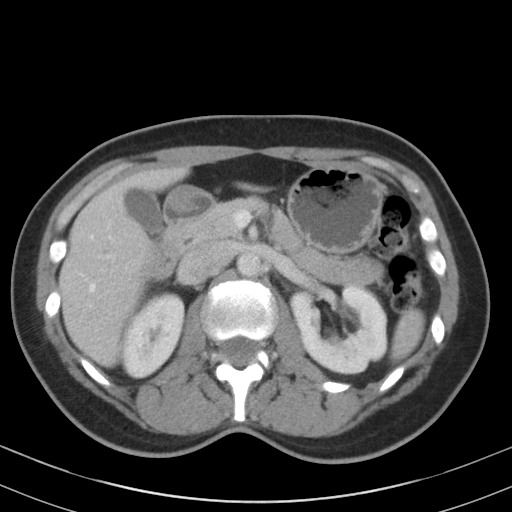
[im 68/93  bone]
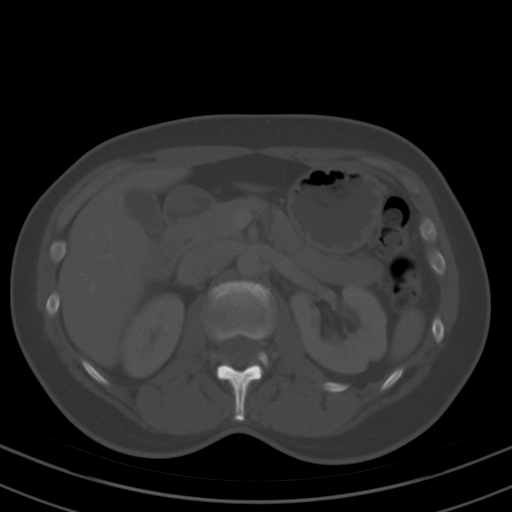
[im 78/93  soft-tissue]
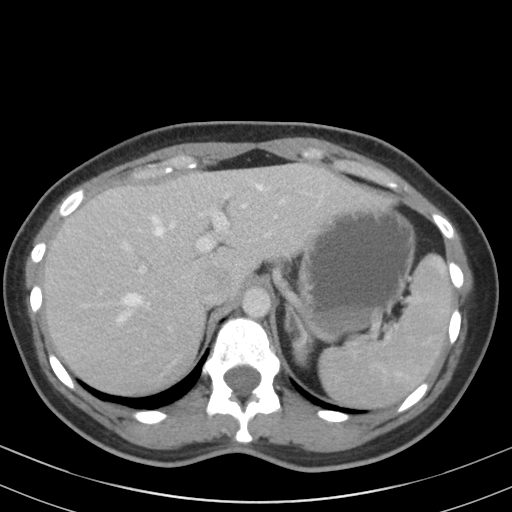
[im 88/93  soft-tissue]
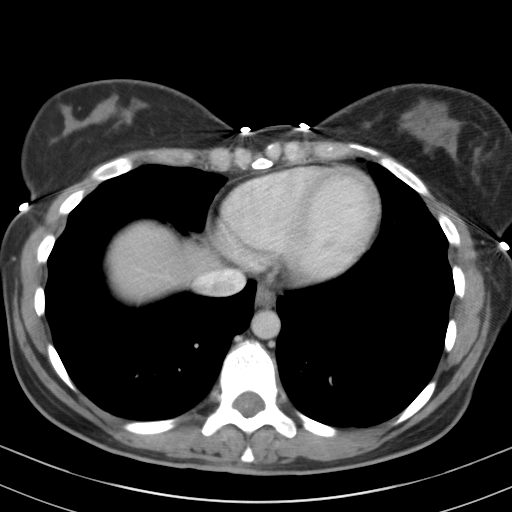

[Series 3: cor · coronal · 0.62mm/px · 3 of 74 slices shown]
[im 25/74  soft-tissue]
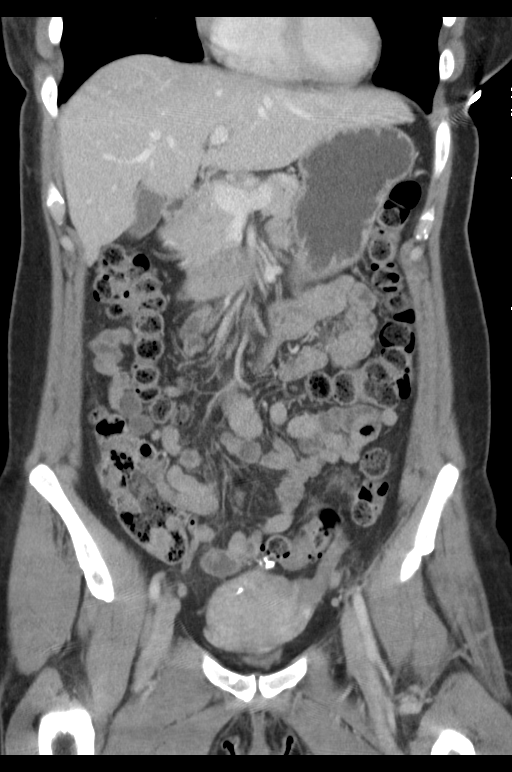
[im 33/74  soft-tissue]
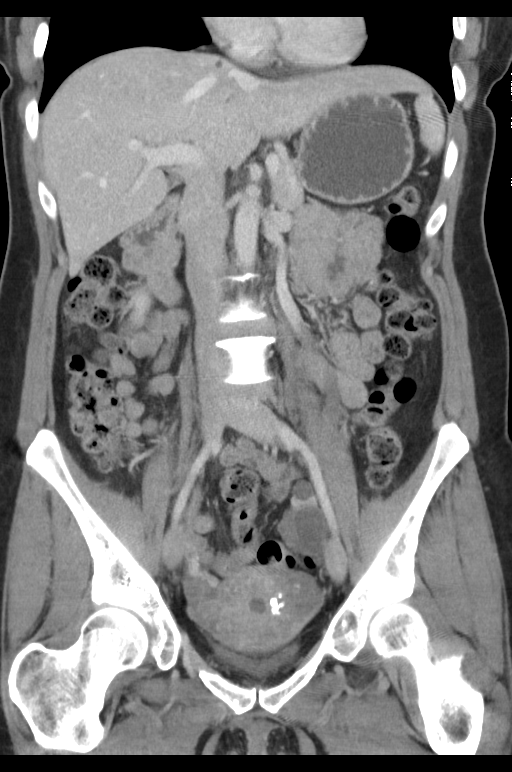
[im 41/74  soft-tissue]
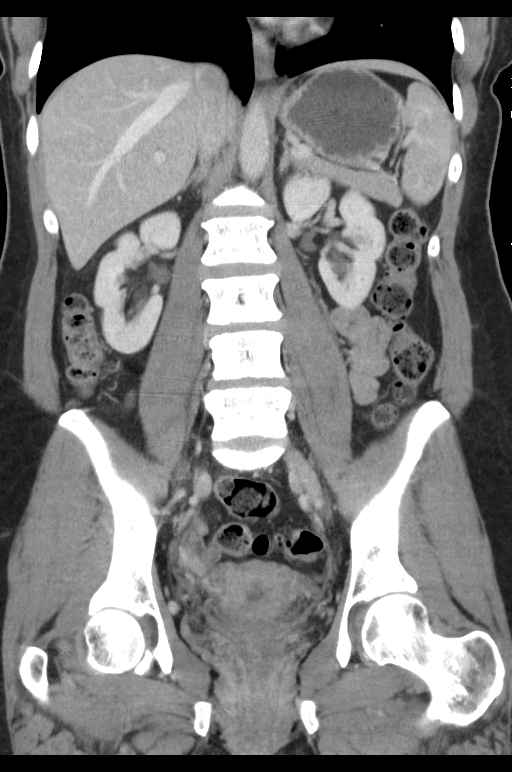

[14 of 46 positions shown; findings below may reference images not displayed]

FINDINGS: Lower chest: The lung bases are unremarkable. Heart size within
normal limits. No pericardial effusion.

Hepatobiliary: Enhanced liver is unremarkable. No focal mass. No
intrahepatic biliary ductal dilatation. No calcified gallstones are
noted within gallbladder.

Pancreas: Enhanced pancreas is unremarkable.

Spleen: Enhanced spleen is unremarkable.

Adrenals/Urinary Tract: Enhanced kidneys are symmetrical in size. No
hydronephrosis or hydroureter. Delayed renal images shows bilateral
renal symmetrical excretion. Small central parapelvic cysts are
noted lower pole of the left kidney. Bilateral visualized proximal
ureter is unremarkable.

Stomach/Bowel: There is no gastric outlet obstruction. No small
bowel obstruction. No thickened or dilated small bowel loops. There
is moderate colonic stool within right colon and transverse colon.
Mild redundant transverse colon. No pericecal inflammation. Normal
appendix is noted in axial image 61. Some colonic stool noted in
descending colon. No distal colonic obstruction. No colitis or
diverticulitis. Moderate stool noted in distal sigmoid colon and
rectum.

Vascular/Lymphatic: There is no aortic aneurysm. No retroperitoneal
or mesenteric adenopathy.

Reproductive: The uterus is anteflexed. Again noted multiple
calcified uterine myometrial fibroids. The largest midline anterior
fundal region axial image 69 measures 2.5 cm. Small amount of
nonspecific fluid noted within uterus. In axial image 65 there is
2.5 cm simple appearing cyst within left ovary. The right ovary is
unremarkable. No pelvic free fluid is noted.

Other: No ascites or free air.  There is no inguinal adenopathy.

Musculoskeletal: Sagittal images of the spine shows mild
degenerative changes thoracolumbar spine. Mild posterior disc bulge
at L4-L5 level. No destructive bony lesions are noted.
IMPRESSION: 1. There is no evidence of acute inflammatory process within
abdomen.
2. Moderate stool noted in right colon and transverse colon. No
pericecal inflammation. Normal appendix. Some colonic stool noted in
descending colon. Moderate to abundant stool in distal sigmoid colon
and rectum. No evidence of colitis or diverticulitis.
3. No small bowel obstruction.
4. A anteflexed uterus. Again noted multiple calcified myometrial
uterine fibroids the largest measures 2.5 cm.
5. There is a simple cyst within left ovary measures 2.5 cm.
Unremarkable right ovary. No pelvic free fluid.
6. Mild degenerative changes thoracolumbar spine.

## 2016-12-08 ENCOUNTER — Other Ambulatory Visit (HOSPITAL_COMMUNITY)
Admission: RE | Admit: 2016-12-08 | Discharge: 2016-12-08 | Disposition: A | Discharge status: Home / Self Care | Payer: BC Managed Care – PPO | Source: Ambulatory Visit | Attending: Family Medicine | Admitting: Family Medicine

## 2016-12-08 ENCOUNTER — Other Ambulatory Visit: Payer: Self-pay | Admitting: Family Medicine

## 2016-12-08 DIAGNOSIS — Z124 Encounter for screening for malignant neoplasm of cervix: Secondary | ICD-10-CM | POA: Diagnosis not present

## 2016-12-09 LAB — CYTOLOGY - PAP
Adequacy: ABSENT
DIAGNOSIS: NEGATIVE
HPV: NOT DETECTED

## 2017-11-29 ENCOUNTER — Other Ambulatory Visit: Payer: Self-pay | Admitting: Physical Medicine and Rehabilitation

## 2017-11-29 DIAGNOSIS — M5412 Radiculopathy, cervical region: Secondary | ICD-10-CM

## 2017-12-07 ENCOUNTER — Other Ambulatory Visit (HOSPITAL_COMMUNITY)
Admission: RE | Admit: 2017-12-07 | Discharge: 2017-12-07 | Disposition: A | Discharge status: Home / Self Care | Payer: BC Managed Care – PPO | Source: Ambulatory Visit | Attending: Family Medicine | Admitting: Family Medicine

## 2017-12-07 ENCOUNTER — Other Ambulatory Visit: Payer: Self-pay | Admitting: Family Medicine

## 2017-12-07 DIAGNOSIS — Z124 Encounter for screening for malignant neoplasm of cervix: Secondary | ICD-10-CM | POA: Insufficient documentation

## 2017-12-11 LAB — CYTOLOGY - PAP: DIAGNOSIS: NEGATIVE

## 2019-07-09 ENCOUNTER — Encounter: Payer: Self-pay | Admitting: Neurology

## 2019-07-09 NOTE — Progress Notes (Signed)
Technical difficulties.  Unable to connect.  Will reschedule for in-office visit.

## 2019-07-10 ENCOUNTER — Telehealth (INDEPENDENT_AMBULATORY_CARE_PROVIDER_SITE_OTHER): Payer: BC Managed Care – PPO | Admitting: Neurology

## 2019-07-10 ENCOUNTER — Encounter: Payer: Self-pay | Admitting: Neurology

## 2019-07-10 ENCOUNTER — Other Ambulatory Visit: Payer: Self-pay

## 2019-07-26 NOTE — Progress Notes (Signed)
NEUROLOGY CONSULTATION NOTE  Dawn Dalton MRN: [id number] DOB: 1970-12-01  Referring provider: Aliene Beams, MD Primary care provider: No PCP  Reason for consult:  Visual field auras  HISTORY OF PRESENT ILLNESS: Dawn Dalton is a 49 year old female who presents for visual field auras.  History supplemented by referring provider's note.    Since November, she has had visual disturbance.  If she wakes up in the middle of the night, she sees geometric patterns in her vision.  Sometimes they are alternating vertical zigzag lines with lines of circles.  Other times, they present as S-like shapes.  They may occur for a few minutes up to 30 minutes.  She notes that both pupils look dilated.  Her left eye feels dry.  There is no associated headache, slurred speech, language disturbance, dizziness or unilateral numbness or weakness.  It seems to occur nightly.  She has more persistent visual symptoms as well.  When she looks at shadows, she sees black streaks.  During the day, she sees a film of snow or static (like on a TV screen), which is constant.  She discontinued Ambien but symptoms persisted still after a month.  She also tried stopping her sertraline but restarted it after symptoms did not improve.  She saw a retina specialist and her eye exam was unremarkable.  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  Her typical migraines described as a half moon shape that slowly grows in the visual field of her left eye for 10 minutes followed by a headache.  They are infrequent and she hasn't had one in over a year.  She has remote history of classic migraines but hasn't had one since having her children.  No family history of known neurologic disease.  Her brother has schizophrenia.    12/19/2018 LABS:  CBC with WBC 5, HGB 13.3, HCT 38.5, PLT 233; CMP with Na 140, K 4.1, Cl 104, CO2 30, glucose 92, BUN 17, t bili 0.4, ALP 54, AST 14, ALT 13; TSH 1.79; vit D  36.14.  PAST MEDICAL HISTORY: Past Medical History:  Diagnosis Date   Cesarean delivery delivered 01/15/2011   Edema end of pregnancy   generalized edema-pt states was seen in Dr Berenda Morale office 8/15-elevated b/p , h/a, worked up for preeclampsia   GERD (gastroesophageal reflux disease)    zantac otc, pregnancy related   H/O: cesarean section 01/15/2011   Headache(784.0)    Normal pregnancy 01/15/2011   Preeclampsia 01/17/2011   Seasonal allergies     PAST SURGICAL HISTORY: Past Surgical History:  Procedure Laterality Date   CESAREAN SECTION  2005   NO PAST SURGERIES      MEDICATIONS: Current Outpatient Medications on File Prior to Visit  Medication Sig Dispense Refill   Calcium Carbonate Antacid (TUMS ULTRA 1000 PO) Take 3-6 tablets by mouth 3 (three) times daily as needed. Take up to 3 times daily as needed for heartburn     cetirizine (ZYRTEC) 10 MG tablet Take 10 mg by mouth daily as needed. As needed for allergies     hydrochlorothiazide 25 MG tablet Take 1 tablet (25 mg total) by mouth daily. 30 tablet 1   lisinopril (ZESTRIL) 5 MG tablet Take 5 mg by mouth daily.     prenatal vitamin w/FE, FA (PRENATAL 1 + 1) 27-1 MG TABS Take 1 tablet by mouth daily. 30 each 2   ranitidine (ZANTAC) 150 MG tablet Take 150 mg by mouth  2 (two) times daily. Take 1-2 times daily as needed for heartburn     zolpidem (AMBIEN) 5 MG tablet Take 2.5 mg by mouth at bedtime as needed for sleep.     No current facility-administered medications on file prior to visit.    ALLERGIES: Allergies  Allergen Reactions   Cefdinir Other (See Comments)    Causes dizziness, disorientation and peritonitus   Doxycycline Calcium Hives and Nausea Only   Erythromycin Ethylsuccinate [Erythromycin Ethylsuccinate] Hives and Other (See Comments)    Causes stomach pain   Hycodan [Hydrocodone-Homatropine] Nausea And Vomiting and Other (See Comments)    Dizziness    FAMILY  HISTORY:   SOCIAL HISTORY: Social History   Socioeconomic History   Marital status: Married    Spouse name: Not on file   Number of children: 2   Years of education: 6   Highest education level: Not on file  Occupational History   Not on file  Tobacco Use   Smoking status: Never Smoker   Smokeless tobacco: Never Used  Substance and Sexual Activity   Alcohol use: No   Drug use: No   Sexual activity: Yes    Birth control/protection: Surgical  Other Topics Concern   Not on file  Social History Narrative   Left handed   Two story home   One cup coffee every am   Social Determinants of Health   Financial Resource Strain:    Difficulty of Paying Living Expenses: Not on file  Food Insecurity:    Worried About Programme researcher, broadcasting/film/video in the Last Year: Not on file   [employer] of Food in the Last Year: Not on file  Transportation Needs:    Lack of Transportation (Medical): Not on file   Lack of Transportation (Non-Medical): Not on file  Physical Activity:    Days of Exercise per Week: Not on file   Minutes of Exercise per Session: Not on file  Stress:    Feeling of Stress : Not on file  Social Connections:    Frequency of Communication with Friends and Family: Not on file   Frequency of Social Gatherings with Friends and Family: Not on file   Attends Religious Services: Not on file   Active Member of Clubs or Organizations: Not on file   Attends Banker Meetings: Not on file   Marital Status: Not on file  Intimate Partner Violence:    Fear of Current or Ex-Partner: Not on file   Emotionally Abused: Not on file   Physically Abused: Not on file   Sexually Abused: Not on file    PHYSICAL EXAM: Blood pressure 134/90, pulse 90, resp. rate 18, height 5\' 6"  (1.676 m), weight 169 lb (76.7 kg), SpO2 96 %. General: No acute distress.  Patient appears well-groomed.  Head:  Normocephalic/atraumatic Eyes:  fundi examined but not  visualized Neck: supple, no paraspinal tenderness, full range of motion Back: No paraspinal tenderness Heart: regular rate and rhythm Lungs: Clear to auscultation bilaterally. Vascular: No carotid bruits. Neurological Exam: Mental status: alert and oriented to person, place, and time, recent and remote memory intact, fund of knowledge intact, attention and concentration intact, speech fluent and not dysarthric, language intact. Cranial nerves: CN I: not tested CN II: pupils equal, round and reactive to light, visual fields intact CN III, IV, VI:  full range of motion, no nystagmus, no ptosis CN V: facial sensation intact CN VII: upper and lower face symmetric CN VIII: hearing intact CN  IX, X: gag intact, uvula midline CN XI: sternocleidomastoid and trapezius muscles intact CN XII: tongue midline Bulk & Tone: normal, no fasciculations. Motor:  5/5 throughout  Sensation:  Pinprick and vibration sensation intact. Deep Tendon Reflexes:  2+ throughout, toes downgoing.  Finger to nose testing:  Without dysmetria.  Heel to shin:  Without dysmetria.  Gait:  Normal station and stride.  Able to turn and tandem walk. Romberg negative.  IMPRESSION: Visual disturbance. 1.  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  However, unclear why they occur nightly 2.  Visual snow  PLAN: 1.  Check MRI of brain with and without contrast.  If without explanation, would refer to neuro-ophthalmology. 2.  Will treat for migraine.  Start topiramate 25mg  at bedtime.  We can increase to 50mg  at bedtime in 4 weeks if needed.  Side effects discussed. 3.  Follow up in 4 months.  Thank you for allowing me to take part in the care of this patient.  Metta Clines, DO  CC: Caren Macadam, MD

## 2019-07-29 ENCOUNTER — Ambulatory Visit: Payer: BC Managed Care – PPO | Admitting: Neurology

## 2019-07-29 ENCOUNTER — Encounter: Payer: Self-pay | Admitting: Neurology

## 2019-07-29 ENCOUNTER — Other Ambulatory Visit: Payer: Self-pay

## 2019-07-29 VITALS — BP 134/90 | HR 90 | Resp 18 | Ht 66.0 in | Wt 169.0 lb

## 2019-07-29 DIAGNOSIS — G43801 Other migraine, not intractable, with status migrainosus: Secondary | ICD-10-CM | POA: Diagnosis not present

## 2019-07-29 DIAGNOSIS — H539 Unspecified visual disturbance: Secondary | ICD-10-CM

## 2019-07-29 DIAGNOSIS — H5319 Other subjective visual disturbances: Secondary | ICD-10-CM

## 2019-07-29 MED ORDER — TOPIRAMATE 25 MG PO TABS: 25.0000 mg | ORAL_TABLET | Freq: Every day | ORAL | 3 refills | Status: AC

## 2019-07-29 NOTE — Patient Instructions (Addendum)
1.  We will check MRI of brain with and without contrast 2.  We will start topiramate 25mg  at bedtime.  If symptoms not improved in 4 weeks, contact me and we can increase dose. 3.  Follow up in 4 months.  We have sent a referral to [facility] for your MRI and they will call you directly to schedule your appointment. They are located at [address] [facility]. If you need to contact them directly please call [phone].

## 2019-08-13 ENCOUNTER — Ambulatory Visit
Admission: RE | Admit: 2019-08-13 | Discharge: 2019-08-13 | Disposition: A | Discharge status: Home / Self Care | Payer: BC Managed Care – PPO | Source: Ambulatory Visit | Attending: Neurology | Admitting: Neurology

## 2019-08-13 ENCOUNTER — Other Ambulatory Visit: Payer: Self-pay

## 2019-08-13 DIAGNOSIS — H539 Unspecified visual disturbance: Secondary | ICD-10-CM

## 2019-08-13 DIAGNOSIS — G43801 Other migraine, not intractable, with status migrainosus: Secondary | ICD-10-CM

## 2019-08-13 MED ORDER — GADOBENATE DIMEGLUMINE 529 MG/ML IV SOLN
15.0000 mL | Freq: Once | INTRAVENOUS | Status: AC | PRN
  Administered 2019-08-13: 15 mL via INTRAVENOUS

## 2019-08-14 ENCOUNTER — Telehealth: Payer: Self-pay

## 2019-08-14 NOTE — Telephone Encounter (Signed)
MRI of brain is normal.  They are likely migraine aura.  However, since they are occurring every night, I can refer her to neuro-ophthalmology (Dr. Gentry Roch) at [facility] for evaluation.  Alternatively, we can wait and see if symptoms improve with the topiramate for migraine.   If they don't improve by the time she sees me at follow up, then I can place a referral.  She is supposed to follow up with me in 4 months. Pt is making her 4 month follow up appointment

## 2019-12-23 NOTE — Progress Notes (Deleted)
NEUROLOGY FOLLOW UP OFFICE NOTE  Dawn Dalton [id number]  HISTORY OF PRESENT ILLNESS: Dawn Dalton is a 49 year old female who follows up for visual disturbance.    UPDATE: MRI of brain with and without contrast on 08/14/2019 personally reviewed and was unremarkable.  In case these were migraine, she was started on topiramate in March to treat for migraine.  ***  HISTORY: Since November 2020, she has had visual disturbance.  If she wakes up in the middle of the night, she sees geometric patterns in her vision.  Sometimes they are alternating vertical zigzag lines with lines of circles.  Other times, they present as S-like shapes.  They may occur for a few minutes up to 30 minutes.  She notes that both pupils look dilated.  Her left eye feels dry.  There is no associated headache, slurred speech, language disturbance, dizziness or unilateral numbness or weakness.  It seems to occur nightly.  She has more persistent visual symptoms as well.  When she looks at shadows, she sees black streaks.  During the day, she sees a film of snow or static (like on a TV screen), which is constant.  She discontinued Ambien but symptoms persisted still after a month.  She also tried stopping her sertraline but restarted it after symptoms did not improve.  She saw a retina specialist and her eye exam was unremarkable.  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  Her typical migraines described as a half moon shape that slowly grows in the visual field of her left eye for 10 minutes followed by a headache.  They are infrequent and she hasn't had one in over a year.  She has remote history of classic migraines but hasn't had one since having her children.  No family history of known neurologic disease.  Her brother has schizophrenia.    PAST MEDICAL HISTORY: Past Medical History:  Diagnosis Date  . Cesarean delivery delivered 01/15/2011  . Edema end of pregnancy    generalized edema-pt states was seen in Dr Berenda Morale office 8/15-elevated b/p , h/a, worked up for preeclampsia  . GERD (gastroesophageal reflux disease)    zantac otc, pregnancy related  . H/O: cesarean section 01/15/2011  . Headache(784.0)   . Normal pregnancy 01/15/2011  . Preeclampsia 01/17/2011  . Seasonal allergies     MEDICATIONS: Current Outpatient Medications on File Prior to Visit  Medication Sig Dispense Refill  . Calcium Carbonate Antacid (TUMS ULTRA 1000 PO) Take 3-6 tablets by mouth 3 (three) times daily as needed. Take up to 3 times daily as needed for heartburn    . cetirizine (ZYRTEC) 10 MG tablet Take 10 mg by mouth daily as needed. As needed for allergies    . Cetirizine HCl (ZYRTEC ALLERGY) 10 MG CAPS Zyrtec    . hydrochlorothiazide 25 MG tablet Take 1 tablet (25 mg total) by mouth daily. 30 tablet 1  . lisinopril (ZESTRIL) 5 MG tablet Take 5 mg by mouth daily.    . prenatal vitamin w/FE, FA (PRENATAL 1 + 1) 27-1 MG TABS Take 1 tablet by mouth daily. 30 each 2  . ranitidine (ZANTAC) 150 MG tablet Take 150 mg by mouth 2 (two) times daily. Take 1-2 times daily as needed for heartburn    . sertraline (ZOLOFT) 50 MG tablet Take 50 mg by mouth daily.    Marland Kitchen topiramate (TOPAMAX) 25 MG tablet Take 1 tablet (25 mg total) by mouth at bedtime.  30 tablet 3  . zolpidem (AMBIEN) 5 MG tablet Take 2.5 mg by mouth at bedtime as needed for sleep.     No current facility-administered medications on file prior to visit.    ALLERGIES: Allergies  Allergen Reactions  . Cefdinir Other (See Comments)    Causes dizziness, disorientation and peritonitus  . Doxycycline Calcium Hives and Nausea Only  . Erythromycin Ethylsuccinate [Erythromycin Ethylsuccinate] Hives and Other (See Comments)    Causes stomach pain  . Hycodan [Hydrocodone-Homatropine] Nausea And Vomiting and Other (See Comments)    Dizziness    FAMILY HISTORY: No family history on file. ***.  SOCIAL HISTORY: Social  History   Socioeconomic History  . Marital status: Married    Spouse name: Not on file  . Number of children: 2  . Years of education: 6  . Highest education level: Not on file  Occupational History  . Not on file  Tobacco Use  . Smoking status: Never Smoker  . Smokeless tobacco: Never Used  Vaping Use  . Vaping Use: Never used  Substance and Sexual Activity  . Alcohol use: Yes  . Drug use: No  . Sexual activity: Yes    Birth control/protection: Surgical  Other Topics Concern  . Not on file  Social History Narrative   Left handed   Two story home   One cup coffee every am   Social Determinants of Health   Financial Resource Strain:   . Difficulty of Paying Living Expenses:   Food Insecurity:   . Worried About Programme researcher, broadcasting/film/video in the Last Year:   . Barista in the Last Year:   Transportation Needs:   . Freight forwarder (Medical):   Marland Kitchen Lack of Transportation (Non-Medical):   Physical Activity:   . Days of Exercise per Week:   . Minutes of Exercise per Session:   Stress:   . Feeling of Stress :   Social Connections:   . Frequency of Communication with Friends and Family:   . Frequency of Social Gatherings with Friends and Family:   . Attends Religious Services:   . Active Member of Clubs or Organizations:   . Attends Banker Meetings:   Marland Kitchen Marital Status:   Intimate Partner Violence:   . Fear of Current or Ex-Partner:   . Emotionally Abused:   Marland Kitchen Physically Abused:   . Sexually Abused:     REVIEW OF SYSTEMS: Constitutional: No fevers, chills, or sweats, no generalized fatigue, change in appetite Eyes: No visual changes, double vision, eye pain Ear, nose and throat: No hearing loss, ear pain, nasal congestion, sore throat Cardiovascular: No chest pain, palpitations Respiratory:  No shortness of breath at rest or with exertion, wheezes GastrointestinaI: No nausea, vomiting, diarrhea, abdominal pain, fecal incontinence Genitourinary:   No dysuria, urinary retention or frequency Musculoskeletal:  No neck pain, back pain Integumentary: No rash, pruritus, skin lesions Neurological: as above Psychiatric: No depression, insomnia, anxiety Endocrine: No palpitations, fatigue, diaphoresis, mood swings, change in appetite, change in weight, increased thirst Hematologic/Lymphatic:  No purpura, petechiae. Allergic/Immunologic: no itchy/runny eyes, nasal congestion, recent allergic reactions, rashes  PHYSICAL EXAM: *** General: No acute distress.  Patient appears ***-groomed.   Head:  Normocephalic/atraumatic Eyes:  Fundi examined but not visualized Neck: supple, no paraspinal tenderness, full range of motion Heart:  Regular rate and rhythm Lungs:  Clear to auscultation bilaterally Back: No paraspinal tenderness Neurological Exam: alert and oriented to person, place, and time. Attention span  and concentration intact, recent and remote memory intact, fund of knowledge intact.  Speech fluent and not dysarthric, language intact.  CN II-XII intact. Bulk and tone normal, muscle strength 5/5 throughout.  Sensation to light touch, temperature and vibration intact.  Deep tendon reflexes 2+ throughout, toes downgoing.  Finger to nose and heel to shin testing intact.  Gait normal, Romberg negative.  IMPRESSION: ***  PLAN: ***  Shon Millet, DO  CC: ***

## 2019-12-24 ENCOUNTER — Ambulatory Visit: Payer: BC Managed Care – PPO | Admitting: Neurology

## 2022-07-12 ENCOUNTER — Emergency Department (HOSPITAL_COMMUNITY)
Admission: EM | Admit: 2022-07-12 | Discharge: 2022-07-12 | Disposition: A | Discharge status: Home / Self Care | Payer: BC Managed Care – PPO | Attending: Emergency Medicine | Admitting: Emergency Medicine

## 2022-07-12 ENCOUNTER — Emergency Department (HOSPITAL_COMMUNITY): Payer: BC Managed Care – PPO

## 2022-07-12 ENCOUNTER — Encounter (HOSPITAL_COMMUNITY): Payer: Self-pay

## 2022-07-12 ENCOUNTER — Other Ambulatory Visit: Payer: Self-pay

## 2022-07-12 DIAGNOSIS — R202 Paresthesia of skin: Secondary | ICD-10-CM | POA: Insufficient documentation

## 2022-07-12 DIAGNOSIS — R739 Hyperglycemia, unspecified: Secondary | ICD-10-CM | POA: Insufficient documentation

## 2022-07-12 DIAGNOSIS — R200 Anesthesia of skin: Secondary | ICD-10-CM | POA: Diagnosis not present

## 2022-07-12 DIAGNOSIS — Y900 Blood alcohol level of less than 20 mg/100 ml: Secondary | ICD-10-CM | POA: Diagnosis not present

## 2022-07-12 DIAGNOSIS — R519 Headache, unspecified: Secondary | ICD-10-CM | POA: Diagnosis not present

## 2022-07-12 LAB — DIFFERENTIAL
Abs Immature Granulocytes: 0.02 10*3/uL (ref 0.00–0.07)
Basophils Absolute: 0.1 10*3/uL (ref 0.0–0.1)
Basophils Relative: 1 %
Eosinophils Absolute: 0.3 10*3/uL (ref 0.0–0.5)
Eosinophils Relative: 5 %
Immature Granulocytes: 0 %
Lymphocytes Relative: 34 %
Lymphs Abs: 2.1 10*3/uL (ref 0.7–4.0)
Monocytes Absolute: 0.5 10*3/uL (ref 0.1–1.0)
Monocytes Relative: 8 %
Neutro Abs: 3.3 10*3/uL (ref 1.7–7.7)
Neutrophils Relative %: 52 %

## 2022-07-12 LAB — URINALYSIS, ROUTINE W REFLEX MICROSCOPIC
Bacteria, UA: NONE SEEN
Bilirubin Urine: NEGATIVE
Glucose, UA: NEGATIVE mg/dL
Ketones, ur: NEGATIVE mg/dL
Nitrite: NEGATIVE
Protein, ur: NEGATIVE mg/dL
Specific Gravity, Urine: 1.011 (ref 1.005–1.030)
pH: 5 (ref 5.0–8.0)

## 2022-07-12 LAB — I-STAT CHEM 8, ED
BUN: 19 mg/dL (ref 6–20)
Calcium, Ion: 1.18 mmol/L (ref 1.15–1.40)
Chloride: 102 mmol/L (ref 98–111)
Creatinine, Ser: 0.7 mg/dL (ref 0.44–1.00)
Glucose, Bld: 150 mg/dL — ABNORMAL HIGH (ref 70–99)
HCT: 38 % (ref 36.0–46.0)
Hemoglobin: 12.9 g/dL (ref 12.0–15.0)
Potassium: 3.5 mmol/L (ref 3.5–5.1)
Sodium: 141 mmol/L (ref 135–145)
TCO2: 25 mmol/L (ref 22–32)

## 2022-07-12 LAB — ETHANOL: Alcohol, Ethyl (B): <10 mg/dL (ref <10)

## 2022-07-12 LAB — CBC
HCT: 37.9 % (ref 36.0–46.0)
Hemoglobin: 12.7 g/dL (ref 12.0–15.0)
MCH: 30.1 pg (ref 26.0–34.0)
MCHC: 33.5 g/dL (ref 30.0–36.0)
MCV: 89.8 fL (ref 80.0–100.0)
Platelets: 239 10*3/uL (ref 150–400)
RBC: 4.22 MIL/uL (ref 3.87–5.11)
RDW: 12.6 % (ref 11.5–15.5)
WBC: 6.3 10*3/uL (ref 4.0–10.5)
nRBC: 0 % (ref 0.0–0.2)

## 2022-07-12 LAB — RAPID URINE DRUG SCREEN, HOSP PERFORMED
Amphetamines: NOT DETECTED
Barbiturates: NOT DETECTED
Benzodiazepines: NOT DETECTED
Cocaine: NOT DETECTED
Opiates: NOT DETECTED
Tetrahydrocannabinol: NOT DETECTED

## 2022-07-12 LAB — COMPREHENSIVE METABOLIC PANEL
ALT: 12 U/L (ref 0–44)
AST: 22 U/L (ref 15–41)
Albumin: 4.5 g/dL (ref 3.5–5.0)
Alkaline Phosphatase: 70 U/L (ref 38–126)
Anion gap: 10 (ref 5–15)
BUN: 20 mg/dL (ref 6–20)
CO2: 26 mmol/L (ref 22–32)
Calcium: 9.3 mg/dL (ref 8.9–10.3)
Chloride: 103 mmol/L (ref 98–111)
Creatinine, Ser: 0.82 mg/dL (ref 0.44–1.00)
GFR, Estimated: >60 mL/min (ref >60)
Glucose, Bld: 151 mg/dL — ABNORMAL HIGH (ref 70–99)
Potassium: 3.5 mmol/L (ref 3.5–5.1)
Sodium: 139 mmol/L (ref 135–145)
Total Bilirubin: 0.4 mg/dL (ref 0.3–1.2)
Total Protein: 7 g/dL (ref 6.5–8.1)

## 2022-07-12 LAB — I-STAT BETA HCG BLOOD, ED (MC, WL, AP ONLY): I-stat hCG, quantitative: <5 m[IU]/mL (ref <5)

## 2022-07-12 LAB — APTT: aPTT: 32 seconds (ref 24–36)

## 2022-07-12 LAB — PROTIME-INR
INR: 1 (ref 0.8–1.2)
Prothrombin Time: 12.8 seconds (ref 11.4–15.2)

## 2022-07-12 MED ORDER — PROCHLORPERAZINE EDISYLATE 10 MG/2ML IJ SOLN
10.0000 mg | Freq: Once | INTRAMUSCULAR | Status: AC
  Administered 2022-07-12: 10 mg via INTRAVENOUS
  Filled 2022-07-12: qty 2

## 2022-07-12 MED ORDER — DIPHENHYDRAMINE HCL 50 MG/ML IJ SOLN
12.5000 mg | Freq: Once | INTRAMUSCULAR | Status: AC
  Administered 2022-07-12: 12.5 mg via INTRAVENOUS
  Filled 2022-07-12: qty 1

## 2022-07-12 MED ORDER — GADOBUTROL 1 MMOL/ML IV SOLN
7.0000 mL | Freq: Once | INTRAVENOUS | Status: AC | PRN
  Administered 2022-07-12: 7 mL via INTRAVENOUS

## 2022-07-12 NOTE — ED Notes (Signed)
Pt transported to MRI 

## 2022-07-12 NOTE — ED Triage Notes (Addendum)
Patient said her PCP sent her over. Numbness on the right side of her tongue since Sunday morning, bad taste in her mouth, forgetting immediate things, headache all over her head. No clot history. No other numbness, no change in vision or balance.

## 2022-07-12 NOTE — ED Notes (Signed)
Pt returned from MRI °

## 2022-07-12 NOTE — ED Notes (Signed)
Caroline,PA cancelled the code stroke.

## 2022-07-12 NOTE — ED Provider Notes (Signed)
[facility] Provider Note   CSN: [id number] Arrival date & time: 07/12/22  1832     History  Chief Complaint  Patient presents with   Headache    Dawn Dalton is a 52 y.o. female with a past medical history significant for GERD, history of headaches who presents to the ED due to numbness/tingling to right side of tongue x 3 days.  Also endorses metallic taste on right side of tongue.  She admits to a daily headache for the past 10 days that waxes and wanes.  History of headaches however, notes this feels different than her typical migraine.  Also endorses forgetfulness.  No speech or visual changes.  Denies unilateral weakness.  Patient evaluated with PCP prior to arrival and sent to the ED for further evaluation.  No previous CVA.  No rash.  History obtained from patient and past medical records. No interpreter used during encounter.       Home Medications Prior to Admission medications   Medication Sig Start Date End Date Taking? Authorizing Provider  Calcium Carbonate Antacid (TUMS ULTRA 1000 PO) Take 3-6 tablets by mouth 3 (three) times daily as needed. Take up to 3 times daily as needed for heartburn    [provider]  cetirizine (ZYRTEC) 10 MG tablet Take 10 mg by mouth daily as needed. As needed for allergies    [provider]  Cetirizine HCl (ZYRTEC ALLERGY) 10 MG CAPS Zyrtec    [provider]  hydrochlorothiazide 25 MG tablet Take 1 tablet (25 mg total) by mouth daily. 01/18/11 07/29/19  Bovard-Stuckert, Jeral Fruit, MD  lisinopril (ZESTRIL) 5 MG tablet Take 5 mg by mouth daily.    [provider]  prenatal vitamin w/FE, FA (PRENATAL 1 + 1) 27-1 MG TABS Take 1 tablet by mouth daily. 01/18/11   Bovard-Stuckert, Jeral Fruit, MD  ranitidine (ZANTAC) 150 MG tablet Take 150 mg by mouth 2 (two) times daily. Take 1-2 times daily as needed for heartburn    [provider]  sertraline (ZOLOFT) 50 MG tablet  Take 50 mg by mouth daily.    [provider]  topiramate (TOPAMAX) 25 MG tablet Take 1 tablet (25 mg total) by mouth at bedtime. 07/29/19   Pieter Partridge, DO  zolpidem (AMBIEN) 5 MG tablet Take 2.5 mg by mouth at bedtime as needed for sleep.    [provider]      Allergies    Cefdinir, Doxycycline calcium, Erythromycin ethylsuccinate, Hycodan [hydrocodone bit-homatrop mbr], Hydrocodone bit-homatrop mbr, and Hydrocodone-acetaminophen    Review of Systems   Review of Systems  Eyes:  Negative for visual disturbance.  Neurological:  Positive for numbness and headaches. Negative for speech difficulty and weakness.  All other systems reviewed and are negative.   Physical Exam Updated Vital Signs BP 132/87   Pulse 94   Temp 98.5 F (36.9 C) (Oral)   Resp 16   Ht 5' 6"$  (1.676 m)   Wt 70.8 kg   SpO2 97%   BMI 25.18 kg/m  Physical Exam Vitals and nursing note reviewed.  Constitutional:      General: She is not in acute distress.    Appearance: She is not ill-appearing.  HENT:     Head: Normocephalic.  Eyes:     Pupils: Pupils are equal, round, and reactive to light.  Cardiovascular:     Rate and Rhythm: Normal rate and regular rhythm.     Pulses: Normal pulses.  Heart sounds: Normal heart sounds. No murmur heard.    No friction rub. No gallop.  Pulmonary:     Effort: Pulmonary effort is normal.     Breath sounds: Normal breath sounds.  Abdominal:     General: Abdomen is flat. There is no distension.     Palpations: Abdomen is soft.     Tenderness: There is no abdominal tenderness. There is no guarding or rebound.  Musculoskeletal:        General: Normal range of motion.     Cervical back: Neck supple.  Skin:    General: Skin is warm and dry.  Neurological:     General: No focal deficit present.     Mental Status: She is alert.     Comments: Speech is clear, able to follow commands CN III-XII intact Normal strength in upper and lower extremities  bilaterally including dorsiflexion and plantar flexion, strong and equal grip strength Sensation grossly intact throughout Moves extremities without ataxia, coordination intact No pronator drift Ambulates without difficulty  Psychiatric:        Mood and Affect: Mood normal.        Behavior: Behavior normal.     ED Results / Procedures / Treatments   Labs (all labs ordered are listed, but only abnormal results are displayed) Labs Reviewed  COMPREHENSIVE METABOLIC PANEL - Abnormal; Notable for the following components:      Result Value   Glucose, Bld 151 (*)    All other components within normal limits  URINALYSIS, ROUTINE W REFLEX MICROSCOPIC - Abnormal; Notable for the following components:   Color, Urine STRAW (*)    Hgb urine dipstick SMALL (*)    Leukocytes,Ua TRACE (*)    All other components within normal limits  I-STAT CHEM 8, ED - Abnormal; Notable for the following components:   Glucose, Bld 150 (*)    All other components within normal limits  ETHANOL  PROTIME-INR  APTT  CBC  DIFFERENTIAL  RAPID URINE DRUG SCREEN, HOSP PERFORMED  I-STAT BETA HCG BLOOD, ED ([facility], [facility], [facility] ONLY)    EKG None  Radiology No results found.  Procedures Procedures    Medications Ordered in ED Medications  prochlorperazine (COMPAZINE) injection 10 mg (10 mg Intravenous Given 07/12/22 1937)  diphenhydrAMINE (BENADRYL) injection 12.5 mg (12.5 mg Intravenous Given 07/12/22 1937)  gadobutrol (GADAVIST) 1 MMOL/ML injection 7 mL (7 mLs Intravenous Contrast Given 07/12/22 2202)    ED Course/ Medical Decision Making/ A&P Clinical Course as of 07/12/22 2205  Tue Jul 12, 2022  1926 Personally evaluated at bedside.  She is a 52 year old female presenting with a chief complaint of headache for 10 days, right-sided numbness tingling and sensory loss on the inside of the mouth.  States that he has a history of headaches but this feels more sinister than prior.  No sudden onset component.  Is  endorsing a weird taste in her mouth as well.  Denies rash or facial paralysis.  No other neurosensory changes. Broad evaluation initiated to evaluate for CVA versus MS versus other neurologic emergency.  Screening labs, MRI with contrast.  If benign findings, patient stable for referral to neurology in the outpatient setting.  [CC]    Clinical Course User Index [CC] Tretha Sciara, MD                             Medical Decision Making Amount and/or Complexity of Data Reviewed Independent Historian: spouse  Labs: ordered. Decision-making details documented in ED Course. Radiology: ordered and independent interpretation performed. Decision-making details documented in ED Course. ECG/medicine tests: ordered and independent interpretation performed. Decision-making details documented in ED Course.  Risk Prescription drug management.   This patient presents to the ED for concern of numbness, this involves an extensive number of treatment options, and is a complaint that carries with it a high risk of complications and morbidity.  The differential diagnosis includes CVA, complicated migraines, electrolyte abnormalities, etc  52 year old female presents to the ED due to numbness/tingling to right-sided tongue x 3 days.  Also admits to forgetfulness.  No previous CVA.  Denies rash.  No visual or speech changes.  Denies unilateral weakness.  Upon arrival, patient afebrile, not tachycardic or hypoxic.  Reassuring physical exam.  Normal neurological exam.  No rash to suggest shingles.  Stroke labs ordered.  MRI brain to rule out evidence of CVA versus MS.  Possible complicated migraine? Migraine cocktail given.  CBC reassuring.  No leukocytosis.  Normal hemoglobin.  CMP significant for hyperglycemia 151.  No anion gap.  Doubt DKA.  Normal renal function.  No major electrolyte derangements.  Ethanol within normal limits.  EKG demonstrates normal sinus rhythm.  No signs of acute ischemia.  Patient  handed off to Quincy Carnes, PA-C at shift change pending MRI brain.  Discussed with Dr. Oswald Hillock who evaluated patient at bedside and agrees with assessment and plan.         Final Clinical Impression(s) / ED Diagnoses Final diagnoses:  Numbness  Acute nonintractable headache, unspecified headache type    Rx / DC Orders ED Discharge Orders     None         Karie Kirks 07/12/22 2206    Tretha Sciara, MD 07/12/22 2213

## 2022-07-12 NOTE — Discharge Instructions (Addendum)
It was a pleasure taking care of you today.  As discussed, all of your labs were reassuring.   Please follow-up with neurologist.  I have placed a referral for neurology, they should contact you with appt.  If you do not hear from them in the next few days, call the office to follow-up on this.  Return to the ER for new or worsening symptoms.

## 2022-07-12 NOTE — ED Provider Notes (Signed)
   CLINICAL DATA: Acute neurologic deficit  EXAM: MRI HEAD WITHOUT AND WITH CONTRAST  TECHNIQUE: Multiplanar, multiecho pulse sequences of the brain and surrounding structures were obtained without and with intravenous contrast.  CONTRAST: 59m GADAVIST GADOBUTROL 1 MMOL/ML IV SOLN  COMPARISON: None Available.  FINDINGS: Brain: No acute infarct, mass effect or extra-axial collection. No acute or chronic hemorrhage. Normal white matter signal, parenchymal volume and CSF spaces. The midline structures are normal. There is no abnormal contrast enhancement.  Vascular: Major flow voids are preserved.  Skull and upper cervical spine: Normal calvarium and skull base. Visualized upper cervical spine and soft tissues are normal.  Sinuses/Orbits:Right maxillary sinus retention cyst. Normal orbits.  IMPRESSION: Normal brain MRI.   Electronically Signed By: KUlyses JarredM.D. On: 07/12/2022 22:34    MRI without acute findings.  Headache has improved, still feels like tongue is numb but able to tolerate PO without issue, speech is clear.  Appears stable for discharge.  Will refer to OP neurology for follow-up.  Can return here for new concerns.   SLarene Pickett PA-C 07/12/22 2316    CTretha Sciara MD 07/13/22 [phone]

## 2023-10-02 ENCOUNTER — Other Ambulatory Visit: Payer: Self-pay | Admitting: Orthopaedic Surgery

## 2023-10-02 DIAGNOSIS — M79671 Pain in right foot: Secondary | ICD-10-CM

## 2023-10-10 ENCOUNTER — Ambulatory Visit
Admission: RE | Admit: 2023-10-10 | Discharge: 2023-10-10 | Disposition: A | Discharge status: Home / Self Care | Payer: Self-pay | Source: Ambulatory Visit | Attending: Orthopaedic Surgery | Admitting: Orthopaedic Surgery

## 2023-10-10 DIAGNOSIS — M79671 Pain in right foot: Secondary | ICD-10-CM
